# Patient Record
Sex: Female | Born: 1950 | Race: White | Hispanic: No | Marital: Married | Smoking: Former smoker
Health system: Southern US, Community
[De-identification: ages and names within clinical notes are randomized; demographics above are authoritative.]

## PROBLEM LIST (undated history)

## (undated) DIAGNOSIS — E538 Deficiency of other specified B group vitamins: Secondary | ICD-10-CM

## (undated) DIAGNOSIS — K760 Fatty (change of) liver, not elsewhere classified: Secondary | ICD-10-CM

## (undated) DIAGNOSIS — Z8619 Personal history of other infectious and parasitic diseases: Secondary | ICD-10-CM

## (undated) DIAGNOSIS — M1990 Unspecified osteoarthritis, unspecified site: Secondary | ICD-10-CM

## (undated) DIAGNOSIS — K5190 Ulcerative colitis, unspecified, without complications: Secondary | ICD-10-CM

## (undated) DIAGNOSIS — F329 Major depressive disorder, single episode, unspecified: Secondary | ICD-10-CM

## (undated) DIAGNOSIS — F32A Depression, unspecified: Secondary | ICD-10-CM

## (undated) DIAGNOSIS — J189 Pneumonia, unspecified organism: Secondary | ICD-10-CM

## (undated) DIAGNOSIS — K219 Gastro-esophageal reflux disease without esophagitis: Secondary | ICD-10-CM

## (undated) DIAGNOSIS — D649 Anemia, unspecified: Secondary | ICD-10-CM

## (undated) DIAGNOSIS — K449 Diaphragmatic hernia without obstruction or gangrene: Secondary | ICD-10-CM

## (undated) HISTORY — DX: Major depressive disorder, single episode, unspecified: F32.9

## (undated) HISTORY — DX: Ulcerative colitis, unspecified, without complications: K51.90

## (undated) HISTORY — DX: Gastro-esophageal reflux disease without esophagitis: K21.9

## (undated) HISTORY — DX: Depression, unspecified: F32.A

## (undated) HISTORY — PX: TONSILLECTOMY: SUR1361

## (undated) HISTORY — DX: Fatty (change of) liver, not elsewhere classified: K76.0

## (undated) HISTORY — PX: APPENDECTOMY: SHX54

## (undated) HISTORY — DX: Diaphragmatic hernia without obstruction or gangrene: K44.9

## (undated) HISTORY — DX: Personal history of other infectious and parasitic diseases: Z86.19

## (undated) HISTORY — DX: Deficiency of other specified B group vitamins: E53.8

---

## 1986-06-09 HISTORY — PX: TUBAL LIGATION: SHX77

## 1993-06-09 HISTORY — PX: TOTAL ABDOMINAL HYSTERECTOMY W/ BILATERAL SALPINGOOPHORECTOMY: SHX83

## 1998-04-19 ENCOUNTER — Ambulatory Visit (HOSPITAL_COMMUNITY): Admission: RE | Admit: 1998-04-19 | Discharge: 1998-04-19 | Payer: Self-pay | Admitting: Obstetrics and Gynecology

## 1999-01-09 ENCOUNTER — Ambulatory Visit (HOSPITAL_COMMUNITY): Admission: RE | Admit: 1999-01-09 | Discharge: 1999-01-09 | Payer: Self-pay | Admitting: Gastroenterology

## 1999-01-09 ENCOUNTER — Encounter: Payer: Self-pay | Admitting: Gastroenterology

## 1999-04-19 ENCOUNTER — Other Ambulatory Visit: Admission: RE | Admit: 1999-04-19 | Discharge: 1999-04-19 | Payer: Self-pay | Admitting: Obstetrics and Gynecology

## 1999-04-29 ENCOUNTER — Other Ambulatory Visit: Admission: RE | Admit: 1999-04-29 | Discharge: 1999-04-29 | Payer: Self-pay | Admitting: Obstetrics and Gynecology

## 1999-04-29 ENCOUNTER — Encounter (INDEPENDENT_AMBULATORY_CARE_PROVIDER_SITE_OTHER): Payer: Self-pay | Admitting: Specialist

## 1999-10-22 ENCOUNTER — Encounter: Payer: Self-pay | Admitting: Obstetrics and Gynecology

## 1999-10-22 ENCOUNTER — Ambulatory Visit (HOSPITAL_COMMUNITY): Admission: RE | Admit: 1999-10-22 | Discharge: 1999-10-22 | Payer: Self-pay | Admitting: Obstetrics and Gynecology

## 2001-04-01 ENCOUNTER — Encounter (INDEPENDENT_AMBULATORY_CARE_PROVIDER_SITE_OTHER): Payer: Self-pay | Admitting: Specialist

## 2001-04-01 ENCOUNTER — Other Ambulatory Visit: Admission: RE | Admit: 2001-04-01 | Discharge: 2001-04-01 | Payer: Self-pay | Admitting: Gastroenterology

## 2001-06-07 DIAGNOSIS — K5150 Left sided colitis without complications: Secondary | ICD-10-CM

## 2003-10-30 ENCOUNTER — Encounter: Admission: RE | Admit: 2003-10-30 | Discharge: 2004-01-28 | Payer: Self-pay | Admitting: Neurology

## 2005-06-26 ENCOUNTER — Ambulatory Visit: Payer: Self-pay | Admitting: Gastroenterology

## 2005-07-21 ENCOUNTER — Encounter (INDEPENDENT_AMBULATORY_CARE_PROVIDER_SITE_OTHER): Payer: Self-pay | Admitting: *Deleted

## 2005-07-21 ENCOUNTER — Ambulatory Visit: Payer: Self-pay | Admitting: Gastroenterology

## 2005-12-09 ENCOUNTER — Ambulatory Visit (HOSPITAL_COMMUNITY): Admission: RE | Admit: 2005-12-09 | Discharge: 2005-12-09 | Payer: Self-pay | Admitting: Obstetrics and Gynecology

## 2007-01-20 ENCOUNTER — Encounter: Admission: RE | Admit: 2007-01-20 | Discharge: 2007-01-20 | Payer: Self-pay | Admitting: Family Medicine

## 2007-01-26 ENCOUNTER — Encounter: Admission: RE | Admit: 2007-01-26 | Discharge: 2007-01-26 | Payer: Self-pay | Admitting: Family Medicine

## 2007-04-22 ENCOUNTER — Ambulatory Visit: Payer: Self-pay | Admitting: Gastroenterology

## 2007-04-22 LAB — CONVERTED CEMR LAB
AST: 17 units/L (ref 0–37)
Albumin: 3.3 g/dL — ABNORMAL LOW (ref 3.5–5.2)
Bilirubin, Direct: 0.1 mg/dL (ref 0.0–0.3)
CO2: 29 meq/L (ref 19–32)
Creatinine, Ser: 0.7 mg/dL (ref 0.4–1.2)
Eosinophils Absolute: 0.5 10*3/uL (ref 0.0–0.6)
Eosinophils Relative: 6.4 % — ABNORMAL HIGH (ref 0.0–5.0)
Folate: 5.9 ng/mL
Glucose, Bld: 93 mg/dL (ref 70–99)
Hemoglobin: 12.4 g/dL (ref 12.0–15.0)
Iron: 44 ug/dL (ref 42–145)
Lymphocytes Relative: 28.9 % (ref 12.0–46.0)
MCV: 88.9 fL (ref 78.0–100.0)
Monocytes Absolute: 0.6 10*3/uL (ref 0.2–0.7)
Neutro Abs: 4.2 10*3/uL (ref 1.4–7.7)
Platelets: 465 10*3/uL — ABNORMAL HIGH (ref 150–400)
Potassium: 4.1 meq/L (ref 3.5–5.1)
Sodium: 138 meq/L (ref 135–145)
Total Bilirubin: 0.5 mg/dL (ref 0.3–1.2)
Total Protein: 6.4 g/dL (ref 6.0–8.3)
Transferrin: 255.1 mg/dL (ref >=212.0)
WBC: 7.4 10*3/uL (ref 4.5–10.5)

## 2007-04-27 ENCOUNTER — Ambulatory Visit: Payer: Self-pay | Admitting: Gastroenterology

## 2007-05-04 ENCOUNTER — Ambulatory Visit: Payer: Self-pay | Admitting: Gastroenterology

## 2007-05-11 ENCOUNTER — Ambulatory Visit: Payer: Self-pay | Admitting: Gastroenterology

## 2007-06-28 DIAGNOSIS — J309 Allergic rhinitis, unspecified: Secondary | ICD-10-CM | POA: Insufficient documentation

## 2007-06-28 DIAGNOSIS — K219 Gastro-esophageal reflux disease without esophagitis: Secondary | ICD-10-CM | POA: Insufficient documentation

## 2007-06-28 DIAGNOSIS — E538 Deficiency of other specified B group vitamins: Secondary | ICD-10-CM

## 2007-06-28 DIAGNOSIS — J45909 Unspecified asthma, uncomplicated: Secondary | ICD-10-CM | POA: Insufficient documentation

## 2007-06-28 DIAGNOSIS — K449 Diaphragmatic hernia without obstruction or gangrene: Secondary | ICD-10-CM | POA: Insufficient documentation

## 2007-08-06 ENCOUNTER — Ambulatory Visit: Payer: Self-pay | Admitting: Gastroenterology

## 2007-08-20 ENCOUNTER — Ambulatory Visit: Payer: Self-pay | Admitting: Gastroenterology

## 2007-08-20 LAB — CONVERTED CEMR LAB
Basophils Absolute: 0 10*3/uL (ref 0.0–0.1)
HCT: 37.7 % (ref 36.0–46.0)
Hemoglobin: 12.3 g/dL (ref 12.0–15.0)
Lymphocytes Relative: 29.4 % (ref 12.0–46.0)
MCHC: 32.6 g/dL (ref 30.0–36.0)
MCV: 91.2 fL (ref 78.0–100.0)
Monocytes Absolute: 0.7 10*3/uL (ref 0.2–0.7)
Monocytes Relative: 6.1 % (ref 3.0–11.0)
Neutro Abs: 6.6 10*3/uL (ref 1.4–7.7)
Neutrophils Relative %: 61.1 % (ref 43.0–77.0)
Sed Rate: 23 mm/hr (ref 0–25)

## 2007-09-07 ENCOUNTER — Ambulatory Visit: Payer: Self-pay | Admitting: Gastroenterology

## 2007-09-07 LAB — CONVERTED CEMR LAB
ALT: 25 units/L (ref 0–35)
AST: 17 units/L (ref 0–37)
Basophils Absolute: 0.2 10*3/uL — ABNORMAL HIGH (ref 0.0–0.1)
Bilirubin, Direct: 0.1 mg/dL (ref 0.0–0.3)
Hemoglobin: 12.4 g/dL (ref 12.0–15.0)
Lymphocytes Relative: 29.9 % (ref 12.0–46.0)
MCHC: 32.5 g/dL (ref 30.0–36.0)
Monocytes Absolute: 0.4 10*3/uL (ref 0.1–1.0)
Monocytes Relative: 5.2 % (ref 3.0–12.0)
Neutro Abs: 4.5 10*3/uL (ref 1.4–7.7)
Platelets: 577 10*3/uL — ABNORMAL HIGH (ref 150–400)
RDW: 13.5 % (ref 11.5–14.6)
Total Bilirubin: 0.6 mg/dL (ref 0.3–1.2)

## 2007-10-21 ENCOUNTER — Ambulatory Visit: Payer: Self-pay | Admitting: Gastroenterology

## 2007-10-21 LAB — CONVERTED CEMR LAB
Albumin: 3.6 g/dL (ref 3.5–5.2)
Basophils Absolute: 0 10*3/uL (ref 0.0–0.1)
Basophils Relative: 0.4 % (ref 0.0–1.0)
Eosinophils Absolute: 0.2 10*3/uL (ref 0.0–0.7)
Eosinophils Relative: 3.5 % (ref 0.0–5.0)
HCT: 39.1 % (ref 36.0–46.0)
MCHC: 32.7 g/dL (ref 30.0–36.0)
MCV: 93.6 fL (ref 78.0–100.0)
Monocytes Absolute: 0.4 10*3/uL (ref 0.1–1.0)
Neutro Abs: 4.1 10*3/uL (ref 1.4–7.7)
Neutrophils Relative %: 60.7 % (ref 43.0–77.0)
RBC: 4.17 M/uL (ref 3.87–5.11)
Total Protein: 6.8 g/dL (ref 6.0–8.3)
WBC: 6.6 10*3/uL (ref 4.5–10.5)

## 2007-11-12 ENCOUNTER — Encounter: Payer: Self-pay | Admitting: Gastroenterology

## 2007-12-07 ENCOUNTER — Ambulatory Visit: Payer: Self-pay | Admitting: Gastroenterology

## 2007-12-07 LAB — CONVERTED CEMR LAB
ALT: 27 units/L (ref 0–35)
AST: 21 units/L (ref 0–37)
Basophils Relative: 0.9 % (ref 0.0–1.0)
Eosinophils Relative: 5.1 % — ABNORMAL HIGH (ref 0.0–5.0)
HCT: 37.4 % (ref 36.0–46.0)
Hemoglobin: 12.6 g/dL (ref 12.0–15.0)
Lymphocytes Relative: 27.9 % (ref 12.0–46.0)
Monocytes Absolute: 0.3 10*3/uL (ref 0.1–1.0)
Monocytes Relative: 5.5 % (ref 3.0–12.0)
Neutro Abs: 3.8 10*3/uL (ref 1.4–7.7)
RBC: 3.97 M/uL (ref 3.87–5.11)
RDW: 15.2 % — ABNORMAL HIGH (ref 11.5–14.6)
Total Bilirubin: 0.7 mg/dL (ref 0.3–1.2)
Total Protein: 6.8 g/dL (ref 6.0–8.3)
WBC: 6.3 10*3/uL (ref 4.5–10.5)

## 2008-02-11 ENCOUNTER — Ambulatory Visit: Payer: Self-pay | Admitting: Gastroenterology

## 2008-02-11 LAB — CONVERTED CEMR LAB
AST: 22 units/L (ref 0–37)
Alkaline Phosphatase: 102 units/L (ref 39–117)
Basophils Absolute: 0.1 10*3/uL (ref 0.0–0.1)
Bilirubin, Direct: 0.1 mg/dL (ref 0.0–0.3)
Eosinophils Absolute: 0.4 10*3/uL (ref 0.0–0.7)
Eosinophils Relative: 7.6 % — ABNORMAL HIGH (ref 0.0–5.0)
Folate: 8.7 ng/mL
Lymphocytes Relative: 27 % (ref 12.0–46.0)
MCV: 94.4 fL (ref 78.0–100.0)
Neutrophils Relative %: 56.1 % (ref 43.0–77.0)
Platelets: 550 10*3/uL — ABNORMAL HIGH (ref 150–400)
Saturation Ratios: 19.7 % — ABNORMAL LOW (ref 20.0–50.0)
Sed Rate: 32 mm/hr — ABNORMAL HIGH (ref 0–22)
Total Protein: 6.6 g/dL (ref 6.0–8.3)
Vitamin B-12: 427 pg/mL (ref 211–911)
WBC: 5.1 10*3/uL (ref 4.5–10.5)

## 2008-02-16 ENCOUNTER — Ambulatory Visit: Payer: Self-pay | Admitting: Gastroenterology

## 2008-02-16 ENCOUNTER — Encounter: Payer: Self-pay | Admitting: Gastroenterology

## 2008-02-21 ENCOUNTER — Telehealth: Payer: Self-pay | Admitting: Gastroenterology

## 2008-02-22 ENCOUNTER — Ambulatory Visit: Payer: Self-pay | Admitting: Gastroenterology

## 2008-02-24 ENCOUNTER — Telehealth: Payer: Self-pay | Admitting: Gastroenterology

## 2008-02-25 ENCOUNTER — Encounter: Payer: Self-pay | Admitting: Gastroenterology

## 2008-02-29 ENCOUNTER — Ambulatory Visit: Payer: Self-pay | Admitting: Gastroenterology

## 2008-02-29 DIAGNOSIS — T50905S Adverse effect of unspecified drugs, medicaments and biological substances, sequela: Secondary | ICD-10-CM | POA: Insufficient documentation

## 2008-03-01 ENCOUNTER — Ambulatory Visit: Payer: Self-pay | Admitting: Gastroenterology

## 2008-03-14 ENCOUNTER — Ambulatory Visit: Payer: Self-pay | Admitting: Gastroenterology

## 2008-04-07 ENCOUNTER — Telehealth: Payer: Self-pay | Admitting: Gastroenterology

## 2008-04-18 ENCOUNTER — Ambulatory Visit: Payer: Self-pay | Admitting: Gastroenterology

## 2008-06-05 ENCOUNTER — Ambulatory Visit: Payer: Self-pay | Admitting: Gastroenterology

## 2009-06-14 ENCOUNTER — Ambulatory Visit (HOSPITAL_COMMUNITY): Admission: RE | Admit: 2009-06-14 | Discharge: 2009-06-14 | Payer: Self-pay | Admitting: Obstetrics and Gynecology

## 2010-10-22 NOTE — Assessment & Plan Note (Signed)
[facility]                         GASTROENTEROLOGY OFFICE NOTE   Whitney Vance, Whitney Vance                     MRN:          [id number]  DATE:04/22/2007                            DOB:          08/31/1950    Ellina was in remission and had a negative colonoscopy with dysplasia on  screening biopsies in February of 2007. After that, she self  discontinued her and has done well until the last several months  when she initially presented to Dr. Marinda Elk with upper abdominal  pain and underwent ultrasound and CT scan of the abdomen, both of which  were apparently normal. She now has had 6-8 weeks of crampy lower  abdominal pain with bloody diarrhea and mild general malaise. She denies  NSAID use or recent antibiotic use. She is on a low fiber diet. Is not  having much abdominal gas or pain. She has had no foreign travel or  known infectious disease exposure. Review of her chart does show that  she has had previous C-difficile colitis.   In talking closely with Kursten, it is rather obvious that she took some  NSAIDs for plantar fasciitis in early August when her colitis flared.  She denies NSAID use at this time and is taking Paxil 20 mg a day,  Prozac 20 mg a day, over-the-counter Prilosec, sinus decongestant  medication and estradiol 0.625 mg  a day. She denies drug allergies.   She is a healthy-appearing, non-toxic white female appearing her stated  age in no distress. She weighs 201 pounds, which is up some 40 pounds  from October of 2002. Blood pressure 120/78, pulse 70 and regular. I  could not appreciate stigmata of chronic liver disease, skin rashes or  edema. Her mental status was clear.  ABDOMEN: Showed no organomegaly, masses, or localized tenderness. Bowel  sounds were normal.  Inspection of the rectum was unremarkable as was rectal examination.  There was soft loose mucusy stool in the rectal vault that was +1 guaiac  positive.   ASSESSMENT:  Blaklee has had a flare up of ulcerative colitis probably  from NSAID use. We will check stool examinations to exclude C-difficile  infection. On reviewing her chart, all of her colitis has been in the  distal rectosigmoid area. She also has a history of sacroiliitis when  her colitis flares. Her previous upper abdominal pain etiology remains  unclear and she had apparently rather a thorough workup to exclude  cholelithiasis.   RECOMMENDATIONS:  1. Check screening laboratory parameters including sed-rate.  2. Low fiber diet as tolerated.  3. Start oral Lialda 2.4 grams a day.  4. Rowasa 4 gram enemas at bedtime with Canasa 1 gram suppository in      the morning.  5. P.r.n. Imodium use.  6. Stool for C-difficile toxin.  7. Avoid NSAIDs and other salicylates.  8. Office followup in two weeks time.     Vania Rea. Jarold Motto, MD, Caleen [place], FAGA  Electronically Signed    DRP/MedQ  DD: 04/22/2007  DT: 04/22/2007  Job #: [id number]

## 2010-10-22 NOTE — Assessment & Plan Note (Signed)
[facility]                         GASTROENTEROLOGY OFFICE NOTE   Whitney Vance, Whitney Vance                       MRN:          [id number]  DATE:08/06/2007                            DOB:          1951-05-31    Taquilla was seen in mid-November with a flare of her colitis, was placed  on Lialda 2.4 g a day, along with Rowasa 4 g enemas at bedtime and 1 g  Canasa suppositories in the morning.  She has had worsening of her  complaints and is now complaining of four to five stools a day with  occasional heme and some crampy lower abdominal pain, but no real  systemic complaints.  As per my previous notes, she has discontinued her  therapy approximately a year and a half ago.  I actually did see the  patient on May 04, 2007, at which time she was remarkably better  and I am surprised she is having such a flare at this time.  She  personally feels that the topical therapy has made her complaints worse.  She denies antibiotics use, but on scanning her chart, I see where she  has had C. difficile infection in the past.  She has certainly had no  fever, chills, skin rashes, joint pains, oral stomatitis or any upper GI  or hepatobiliary complaints.  She also denies any systemic complaints,  anorexia, weight-loss or general malaise, etc.   She is on a variety of medications, including over-the-counter Prilosec,  calcium, aminosalicylates and Paxil 20 mg a day.  She was borderline B12  deficient.  We have begun parenteral B12 replacement and she is on nasal  spray weekly.   EXAM:  Shows her to be awake and alert, in no acute distress.  She  appears her stated age.  She weighs 196 pounds and blood pressure 112/70 and pulse was 70 and  regular.  Her abdominal exam is entirely benign without organomegaly, masses or  tenderness.  Bowel sounds were normal.  Inspection of rectum was unremarkable, as was rectal exam, with soft,  liquidy stool that was not  guaiac-positive at this time.   ASSESSMENT:  Mrs. Monaco has had a flare of her colitis, which has not  been controlled with oral and topical aminosalicylate therapy.  In fact,  she may have had worsening with Rowasa enemas.  She was under good  control for many years on therapy and we need to get her back on  chronic immunosuppressive therapy.   RECOMMENDATIONS:  1. Brief course of steroid therapy in the form of prednisone 30 mg a      day for two weeks, then 20 mg a day for two weeks.  Office followup      in one month.  2. Discontinue Rowasa and Canasa.  3. Continue Lialda 2.4 g a day.  4. Restart 100 mg a day.  5. Check CBC and sed rate in two weeks' time and when she returns in      one month.  6. Continue other medications, listed above, per Dr. Foy Guadalajara.     Onalee Hua  Hale Bogus, MD, Caleen [place], [place]  Electronically Signed    DRP/MedQ  DD: 08/06/2007  DT: 08/06/2007  Job #: [id number]   cc:   Molly Maduro L. Foy Guadalajara, M.D.

## 2010-10-22 NOTE — Assessment & Plan Note (Signed)
[facility]                         GASTROENTEROLOGY OFFICE NOTE   Whitney Vance, Whitney Vance                       MRN:          [id number]  DATE:09/07/2007                            DOB:          [birth year]    HISTORY OF PRESENT ILLNESS:  Whitney Vance has had a rather remarkable response  to corticosteroid therapy, is now in remission without diarrhea,  abdominal pain, rectal bleeding.  She was on prednisone 30 mg a day for  two weeks and has completed a two week course of 20 mg daily while  continuing her Lialda 2.4 g a day, and we have also reinstituted 100  mg daily.  She was on that beginning in the year 2000 and discontinued a  year and a half ago.  She had mostly left sided ulcerative colitis that  has been well maintained in the past on immunosuppressive therapy.   PHYSICAL EXAMINATION:  VITAL SIGNS:  All normal.  ABDOMEN:  Unremarkable.   RECOMMENDATIONS:  1. Continue prednisone taper at a level of 5 mg per day every two      weeks.  2. Continue Lialda and at current doses.  3. Check CBC and liver profile today, again in a month with office      follow up in six weeks time.  4. Patient to call, will adjust her medications according to her      symptoms should she have a relapse with this rather rapid steroid      taper.     Vania Rea. Jarold Motto, MD, Caleen [place], FAGA  Electronically Signed    DRP/MedQ  DD: 09/07/2007  DT: 09/07/2007  Job #: [id number]   cc:   Molly Maduro L. Foy Guadalajara, M.D.

## 2012-08-28 ENCOUNTER — Other Ambulatory Visit: Payer: Self-pay | Admitting: Nurse Practitioner

## 2012-08-31 NOTE — Telephone Encounter (Signed)
08/31/12 LMTCB needs VIt D Level checked on AEXAM--Needs to schedule Aex.cm

## 2013-09-28 HISTORY — PX: TARSAL TUNNEL RELEASE: SUR1099

## 2013-09-28 HISTORY — PX: FOOT SURGERY: SHX648

## 2013-11-15 ENCOUNTER — Encounter: Payer: Self-pay | Admitting: Internal Medicine

## 2013-11-16 ENCOUNTER — Ambulatory Visit (INDEPENDENT_AMBULATORY_CARE_PROVIDER_SITE_OTHER): Payer: 59 | Admitting: Internal Medicine

## 2013-11-16 ENCOUNTER — Encounter: Payer: Self-pay | Admitting: Internal Medicine

## 2013-11-16 VITALS — BP 110/78 | HR 70 | Ht 63.0 in | Wt 207.0 lb

## 2013-11-16 DIAGNOSIS — K5130 Ulcerative (chronic) rectosigmoiditis without complications: Secondary | ICD-10-CM

## 2013-11-16 DIAGNOSIS — A0472 Enterocolitis due to Clostridium difficile, not specified as recurrent: Secondary | ICD-10-CM

## 2013-11-16 MED ORDER — SACCHAROMYCES BOULARDII 250 MG PO CAPS: 250.0000 mg | ORAL_CAPSULE | Freq: Two times a day (BID) | ORAL | Status: DC

## 2013-11-16 NOTE — Patient Instructions (Signed)
We have sent the following medications to your pharmacy for you to pick up at your convenience:  Florastor 250 mg twice a day  Take flagyl for 10 days, if your PCP did not prescribe it for 10 days please call us and we will send in a prescription.  Follow up in 2-3  Weeks appointment can be with a P.A.

## 2013-11-16 NOTE — Progress Notes (Signed)
Patient ID: Whitney Vance, female   DOB: 06/15/1950, 63 y.o.   MRN: [id number] HPI: Whitney Vance is a 63 yo female with past medical history of ulcerative proctosigmoiditis, GERD, B12 deficiency who was previously followed by Whitney Vance but has been lost to followup returning today to reestablish care with colitis complaints. She was referred by her primary care provider, Whitney Vance.  She reports she had many years of proctosigmoiditis and required oral and per rectum 5-ASA as well as cort enemas.  She reports she has been in clinical remission for many years. She reports occasional flares if she ate certain foods such as red meat, but symptoms would resolve when she eliminated the inciting food. She reports she was treated several months ago for URI and tonsillitis with antibiotics, and then she had right foot surgery on 09/28/2013 also requiring antibiotics. 3 weeks ago she developed lower abdominal cramping pain, rectal pressure which seemed to radiate down her legs, increased gas and tenesmus. She developed mucus and blood in her stools. Now over the past 5-6 days she's had pure diarrhea occurring 5-6 times per day with fecal urgency. Stool studies were performed recently by primary care and she was told by phone while she was driving here today that her stool was positive for C. difficile. She said Flagyl was called and she has not picked this up.  She denies fevers or chills. Reports good appetite. No heartburn, dysphagia or odynophagia. No nausea or vomiting. She has never had C. difficile before. Her mother had ulcerative colitis  Past Medical History  Diagnosis Date  . Vitamin B 12 deficiency   . Ulcerative colitis   . Asthma   . Hiatal hernia   . GERD (gastroesophageal reflux disease)   . H/O Clostridium difficile infection     has it now 11/16/2013  . Depression     Past Surgical History  Procedure Laterality Date  . Foot surgery      09/2013  . Vaginal hysterectomy     . Appendectomy      Current Outpatient Prescriptions  Medication Sig Dispense Refill  . Glucosamine-Chondroit-Vit C-Mn (GLUCOSAMINE 1500 COMPLEX) CAPS Take by mouth 2 (two) times daily.      . Lactobacillus Rhamnosus, GG, (CULTURELLE) CAPS Take by mouth.      Marland Kitchen omeprazole (PRILOSEC) 20 MG capsule Take 20 mg by mouth daily.      Marland Kitchen PARoxetine (PAXIL) 20 MG tablet Take 20 mg by mouth daily.      Marland Kitchen saccharomyces boulardii (FLORASTOR) 250 MG capsule Take 1 capsule (250 mg total) by mouth 2 (two) times daily.  60 capsule  3   No current facility-administered medications for this visit.    Allergies  Allergen Reactions  . Demerol [Meperidine]     Family History  Problem Relation Age of Onset  . Ulcerative colitis Mother     History  Substance Use Topics  . Smoking status: Former Games developer  . Smokeless tobacco: Never Used  . Alcohol Use: No    ROS: As per history of present illness, otherwise negative  BP 110/78  Pulse 70  Ht 5\' 3"  (1.6 m)  Wt 207 lb (93.895 kg)  BMI 36.68 kg/m2 Constitutional: Well-developed and well-nourished. No distress. HEENT: Normocephalic and atraumatic. Oropharynx is clear and moist. No oropharyngeal exudate. Conjunctivae are normal.  No scleral icterus. Neck: Neck supple. Trachea midline. Cardiovascular: Normal rate, regular rhythm and intact distal pulses. No M/R/G Pulmonary/chest: Effort normal and breath sounds normal.  No wheezing, rales or rhonchi. Abdominal: Soft, mild lower abdominal tenderness without rebound or guard, nondistended. Bowel sounds active throughout.  Extremities: no clubbing, cyanosis, or edema, orthotic shoe on right foot Lymphadenopathy: No cervical adenopathy noted. Neurological: Alert and oriented to person place and time. Skin: Skin is warm and dry. No rashes noted. Psychiatric: Normal mood and affect. Behavior is normal.  RELEVANT LABS --Labs done recently by [place]. Records have  been requested  Flexible sigmoidoscopy September 09 -- Dr. Jarold MottoPatterson, proctosigmoiditis to 18 cm, otherwise normal Colonoscopy February 07 -- normal, colitis in remission  ASSESSMENT/PLAN: 63 yo female with past medical history of ulcerative proctosigmoiditis, GERD, B12 deficiency who was previously followed by Dr. Sheryn Bisonavid Vance but has been lost to followup returning today to reestablish care with colitis complaints.  1.  C. difficile colitis/history of proctosigmoiditis -- difficult to know if all of her symptoms are secondary to C. difficile colitis or if her proctosigmoiditis has recurred. Certainly C. difficile infection can worsen proctosigmoiditis. I recommend that she proceed with Flagyl 500 mg 3 times a day x10 days and also begin Florastor 250 mg twice daily for one month. She may require treatment of proctosigmoiditis with 5-ASA, or even steroid, but I would like her to complete Flagyl therapy first to determine if further treatment is necessary. I do think she needs repeat colonoscopy for reassessment but after adequate treatment for and resolution of C. Difficile. -- I have asked that she return in 2 weeks to see me for an advanced practitioner for reassessment. If symptoms persist, would ensure C. difficile has been adequately treated and resolved, and then begin therapy for proctosigmoiditis and arrange procedure. --Labs requested from Dr. [place]

## 2013-11-23 ENCOUNTER — Other Ambulatory Visit (HOSPITAL_COMMUNITY): Payer: Self-pay | Admitting: Family

## 2013-11-23 ENCOUNTER — Telehealth: Payer: Self-pay | Admitting: Internal Medicine

## 2013-11-23 DIAGNOSIS — Z1231 Encounter for screening mammogram for malignant neoplasm of breast: Secondary | ICD-10-CM

## 2013-11-23 MED ORDER — MESALAMINE 1000 MG RE SUPP: 1000.0000 mg | Freq: Every day | RECTAL | Status: DC

## 2013-11-23 MED ORDER — VANCOMYCIN 50 MG/ML ORAL SOLUTION: 125.0000 mg | Freq: Four times a day (QID) | ORAL | Status: DC

## 2013-11-23 NOTE — Telephone Encounter (Signed)
Patient reports very minimal improvement in her symptoms on cipro and flagyl.  She will complete the antibiotics on Saturday.  She is still having cramping, bloody diarrhea 6-7 times a day.  Please advise the next step

## 2013-11-23 NOTE — Telephone Encounter (Signed)
Patient notified rx sent to the requested pharmacies

## 2013-11-23 NOTE — Telephone Encounter (Signed)
This patient was diagnosed with C. difficile by PCR performed by primary care She is being treated with metronidazole only, not Cipro If she has failed to respond, I would change to oral vancomycin 250 mg 4 times a day x10 days She may require 5-ASA therapy given her history of proctosigmoiditis, but C. difficile colitis needs to be adequately treated first. She can start Canasa suppository 1000 mg each bedtime Have her call prior to stopping vancomycin to update us on her symptoms

## 2013-12-02 ENCOUNTER — Other Ambulatory Visit (INDEPENDENT_AMBULATORY_CARE_PROVIDER_SITE_OTHER): Payer: 59

## 2013-12-02 ENCOUNTER — Telehealth: Payer: Self-pay | Admitting: Internal Medicine

## 2013-12-02 ENCOUNTER — Other Ambulatory Visit: Payer: Self-pay

## 2013-12-02 DIAGNOSIS — R197 Diarrhea, unspecified: Secondary | ICD-10-CM

## 2013-12-02 DIAGNOSIS — R109 Unspecified abdominal pain: Secondary | ICD-10-CM

## 2013-12-02 LAB — COMPREHENSIVE METABOLIC PANEL
ALT: 17 U/L (ref 0–35)
AST: 19 U/L (ref 0–37)
Albumin: 3.9 g/dL (ref 3.5–5.2)
Alkaline Phosphatase: 111 U/L (ref 39–117)
BUN: 11 mg/dL (ref 6–23)
CO2: 29 mEq/L (ref 19–32)
Calcium: 9.2 mg/dL (ref 8.4–10.5)
Chloride: 102 mEq/L (ref 96–112)
Creatinine, Ser: 0.7 mg/dL (ref 0.4–1.2)
GFR: 92.72 mL/min (ref >60.00)
Glucose, Bld: 95 mg/dL (ref 70–99)
Potassium: 4 mEq/L (ref 3.5–5.1)
Sodium: 138 mEq/L (ref 135–145)
Total Bilirubin: 0.3 mg/dL (ref 0.2–1.2)
Total Protein: 7.9 g/dL (ref 6.0–8.3)

## 2013-12-02 LAB — CBC WITH DIFFERENTIAL/PLATELET
BASOS PCT: 0.6 % (ref 0.0–3.0)
Basophils Absolute: 0.1 10*3/uL (ref 0.0–0.1)
EOS ABS: 0.4 10*3/uL (ref 0.0–0.7)
EOS PCT: 3.7 % (ref 0.0–5.0)
HCT: 39.1 % (ref 36.0–46.0)
Hemoglobin: 13 g/dL (ref 12.0–15.0)
LYMPHS PCT: 27.1 % (ref 12.0–46.0)
Lymphs Abs: 2.9 10*3/uL (ref 0.7–4.0)
MCHC: 33.3 g/dL (ref 30.0–36.0)
MCV: 88.4 fl (ref 78.0–100.0)
Monocytes Absolute: 0.7 10*3/uL (ref 0.1–1.0)
Monocytes Relative: 6.9 % (ref 3.0–12.0)
Neutro Abs: 6.5 10*3/uL (ref 1.4–7.7)
Neutrophils Relative %: 61.7 % (ref 43.0–77.0)
Platelets: 516 10*3/uL — ABNORMAL HIGH (ref 150.0–400.0)
RBC: 4.43 Mil/uL (ref 3.87–5.11)
RDW: 15.3 % (ref 11.5–15.5)
WBC: 10.6 10*3/uL — AB (ref 4.0–10.5)

## 2013-12-02 LAB — C-REACTIVE PROTEIN: CRP: 3.6 mg/dL (ref 0.5–20.0)

## 2013-12-02 NOTE — Telephone Encounter (Signed)
Patient agrees to come today for the lab work-she has an appt on 12/05/13 with Amy Esterwood-an appt is scheduled for CT abd.with contrast for the afternoon of 12/05/13-left her a message to call back about the CT

## 2013-12-02 NOTE — Telephone Encounter (Signed)
Patient calls reporting that she is being awakened by the urgent need to go to the bathroom. She is passing gas/blood and sees the most blood first thing in the morning. Has 7 to 8 stools a day. Left sided abdominal tenderness with new onset of low back discomfort. Afebrile, actually c/o feeling cold at times.She is nauseated. Please advise.

## 2013-12-02 NOTE — Telephone Encounter (Signed)
Have her come for CBC, CMP, CRP Also the CT scan of the abdomen and pelvis with contrast  Repeat C diff PCR (run STAT) She may require steroids for acute colitis flare but would like CT imaging 1st

## 2013-12-05 ENCOUNTER — Encounter: Payer: Self-pay | Admitting: Physician Assistant

## 2013-12-05 ENCOUNTER — Ambulatory Visit: Payer: 59

## 2013-12-05 ENCOUNTER — Ambulatory Visit (INDEPENDENT_AMBULATORY_CARE_PROVIDER_SITE_OTHER): Payer: 59 | Admitting: Physician Assistant

## 2013-12-05 ENCOUNTER — Other Ambulatory Visit: Payer: 59

## 2013-12-05 VITALS — BP 134/72 | HR 96 | Ht 63.0 in | Wt 207.0 lb

## 2013-12-05 DIAGNOSIS — R197 Diarrhea, unspecified: Secondary | ICD-10-CM

## 2013-12-05 DIAGNOSIS — A0472 Enterocolitis due to Clostridium difficile, not specified as recurrent: Secondary | ICD-10-CM

## 2013-12-05 DIAGNOSIS — K51919 Ulcerative colitis, unspecified with unspecified complications: Secondary | ICD-10-CM

## 2013-12-05 DIAGNOSIS — K5190 Ulcerative colitis, unspecified, without complications: Secondary | ICD-10-CM

## 2013-12-05 MED ORDER — MOVIPREP 100 G PO SOLR: 1.0000 | Freq: Once | ORAL | Status: DC

## 2013-12-05 MED ORDER — ONDANSETRON HCL 4 MG PO TABS: 4.0000 mg | ORAL_TABLET | Freq: Three times a day (TID) | ORAL | Status: DC | PRN

## 2013-12-05 MED ORDER — DICYCLOMINE HCL 10 MG PO CAPS: 10.0000 mg | ORAL_CAPSULE | Freq: Three times a day (TID) | ORAL | Status: DC

## 2013-12-05 MED ORDER — MOVIPREP 100 G PO SOLR: 1.0000 | ORAL | Status: DC

## 2013-12-05 MED ORDER — VANCOMYCIN 50 MG/ML ORAL SOLUTION: 125.0000 mg | Freq: Four times a day (QID) | ORAL | Status: DC

## 2013-12-05 NOTE — Patient Instructions (Addendum)
Please go to the basement level lab for a stool study. We sent refills to [facility] for: 1. Vancomycin liquid 2. zofran 3. Bentyl 10 mg  You have been scheduled for a colonoscopy. Please follow written instructions given to you at your visit today.  Please pick up your prep kit at the pharmacy within the next 1-3 days. If you use inhalers (even only as needed), please bring them with you on the day of your procedure. Your physician has requested that you go to www.startemmi.com and enter the access code given to you at your visit today. This web site gives a general overview about your procedure. However, you should still follow specific instructions given to you by our office regarding your preparation for the procedure.

## 2013-12-05 NOTE — Progress Notes (Addendum)
Subjective:    Patient ID: Whitney Vance, female    DOB: [birth year], 63 y.o.   MRN: [id number]  HPI  Whitney Vance is a pleasant 63 year old white female known recently to Whitney Vance ,former patient of Whitney Vance's with history of ulcerative proctosigmoiditis, GERD and B12 deficiency. She was seen on 11/16/2013 and stated that she had been in remission for many years until the past couple of months. She says she had a course of antibiotics a few months ago for an upper respiratory infection and also had antibiotics after foot surgery. In in early June she developed lower abdominal cramping rectal pressure gas and tenesmus type symptoms. She started noticing mucus and blood in her stools and then began with diarrhea. She had a stool for C. difficile done by her PCP and this was positive. She was started on a course of Flagyl. When seen by Whitney Vance on 610 was felt that she should have followup colonoscopy after C. difficile infection had cleared. She was asked to complete a course of Flagyl and also take floor store over the next one month. She comes in today for followup. In the interim she had called and stated she was not feeling any better and has been started on vancomycin 254 times a day. She has been on this over the past 10-11 days and says that she may be a little bit better but is still having significant symptoms. She has had 6 bowel movements today already. She says on a bad day she'll have 10 or 11 bowel movements per day some of these are very small bowel volume an urgent. She is still seeing blood with each bowel movement. No documented fever or chills. She has had some queasiness and had an episode of nausea and vomiting earlier today. She continues to complain of lower abdominal cramping . She has also been started on Canasa which she is using at bedtime. She is scheduled for CT of the abdomen and pelvis later   today    Review of Systems  Constitutional: Positive for appetite change and  fatigue.  HENT: Negative.   Eyes: Negative.   Respiratory: Negative.   Cardiovascular: Negative.   Gastrointestinal: Positive for nausea, abdominal pain, diarrhea, blood in stool and anal bleeding.  Endocrine: Negative.   Genitourinary: Negative.   Musculoskeletal: Negative.   Allergic/Immunologic: Negative.   Neurological: Negative.   Hematological: Negative.   Psychiatric/Behavioral: Negative.    Outpatient Prescriptions Prior to Visit  Medication Sig Dispense Refill  . Glucosamine-Chondroit-Vit C-Mn (GLUCOSAMINE 1500 COMPLEX) CAPS Take by mouth 2 (two) times daily.      . mesalamine (CANASA) 1000 MG suppository Place 1 suppository (1,000 mg total) rectally at bedtime.  30 suppository  12  . omeprazole (PRILOSEC) 20 MG capsule Take 20 mg by mouth daily.      Marland Kitchen. PARoxetine (PAXIL) 20 MG tablet Take 20 mg by mouth daily.      Marland Kitchen. saccharomyces boulardii (FLORASTOR) 250 MG capsule Take 1 capsule (250 mg total) by mouth 2 (two) times daily.  60 capsule  3  . vancomycin (VANCOCIN) 50 mg/mL oral solution Take 2.5 mLs (125 mg total) by mouth 4 (four) times daily.  140 mL  0  . Lactobacillus Rhamnosus, GG, (CULTURELLE) CAPS Take by mouth.       No facility-administered medications prior to visit.   Allergies  Allergen Reactions  . Demerol [Meperidine]        Patient Active Problem List   Diagnosis Date  Noted  . ADVERSE DRUG REACTION, LATE EFFECT 02/29/2008  . B12 DEFICIENCY 06/28/2007  . ALLERGIC RHINITIS 06/28/2007  . ASTHMA 06/28/2007  . GERD 06/28/2007  . HIATAL HERNIA 06/28/2007  . ULCERATIVE COLITIS, LEFT SIDED 06/07/2001   family history includes Ulcerative colitis in her mother. History   Social History  . Marital Status: Married    Spouse Name: N/A    Number of Children: N/A  . Years of Education: N/A   Occupational History  . 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 Smoking status: Former Games developermoker  . Smokeless tobacco: Never Used  . Alcohol Use: No    . Drug Use: No  . Sexual Activity: Not on file   Other Topics Concern  . Not on file   Social History Narrative  . No narrative on file     History  Substance Use Topics  . Smoking status: Former Games developermoker  . Smokeless tobacco: Never Used  . Alcohol Use: No      Physical Exam       well-developed older white female in no acute distress blood pressure 134/72 pulse 96 height 5 foot 3 weight 207. HEENT nontraumatic normocephalic EOMI PERRLA sclera anicteric, Supple no JVD, Cardiovascular regular rate and rhythm with S1-S2 no murmur or gallop, Ulnar clear bilaterally, Abdomen soft she is tender bilateral lower quadrants left greater than right there is no guarding or rebound no palpable mass or hepatosplenomegaly bowel sounds are present, Rectal exam not done today extremities no clubbing cyanosis or edema skin warm dry Assessment & Plan:  #681  63 year old female with known ulcerative proctosigmoiditis with exacerbation x 6 weeks #2 C. difficile colitis-currently on vancomycin and florastor Patient with persistent diarrhea abdominal cramping tenesmus and hematochezia Need to sort out whether this is all secondary to ulcerative colitis at this point versus refractory C. difficile superimposed on ulcerative colitis.  Plan; patient had labs on 12/02/2013 these were unremarkable We'll continue vancomycin 250 mg by mouth 4 times daily x1 more week to complete a total of 21 days She is to stay on floor store twice daily for another couple of weeks Add Bentyl 10 mg one half hour a.c. and at bedtime Zofran 4 mg every 6 hours when necessary for nausea Continue Canasa thousand milligrams each bedtime Repeat stool for C. difficile by PCR Cancel CT scan of the abdomen and pelvis Proceed to colonoscopy with Whitney Vance with biopsies. Procedure discussed in detail with the patient she is agreeable to proceed.  Addendum: Reviewed and agree with management. Beverley FiedlerJay M Pyrtle, MD

## 2013-12-07 ENCOUNTER — Other Ambulatory Visit: Payer: 59

## 2013-12-07 DIAGNOSIS — K51919 Ulcerative colitis, unspecified with unspecified complications: Secondary | ICD-10-CM

## 2013-12-07 DIAGNOSIS — A0472 Enterocolitis due to Clostridium difficile, not specified as recurrent: Secondary | ICD-10-CM

## 2013-12-07 DIAGNOSIS — R197 Diarrhea, unspecified: Secondary | ICD-10-CM

## 2013-12-08 ENCOUNTER — Encounter: Payer: Self-pay | Admitting: Internal Medicine

## 2013-12-09 LAB — CLOSTRIDIUM DIFFICILE BY PCR: CDIFFPCR: NOT DETECTED

## 2013-12-12 ENCOUNTER — Telehealth: Payer: Self-pay

## 2013-12-12 NOTE — Telephone Encounter (Signed)
Message copied by Evalee JeffersonMCKEW, Tavion Senkbeil A on Mon Dec 12, 2013  2:04 PM ------      Message from: Beverley FiedlerPYRTLE, JAY M      Created: Mon Dec 12, 2013 12:01 PM       Repeat C. difficile negative, proceed to colonoscopy as recently scheduled ------

## 2013-12-12 NOTE — Telephone Encounter (Signed)
Patient advised of results and plan of care

## 2013-12-12 NOTE — Telephone Encounter (Signed)
Left message to call.

## 2013-12-13 ENCOUNTER — Encounter: Payer: Self-pay | Admitting: Physician Assistant

## 2013-12-14 ENCOUNTER — Encounter: Payer: Self-pay | Admitting: Internal Medicine

## 2013-12-14 ENCOUNTER — Ambulatory Visit (AMBULATORY_SURGERY_CENTER): Payer: 59 | Admitting: Internal Medicine

## 2013-12-14 ENCOUNTER — Ambulatory Visit (HOSPITAL_COMMUNITY): Payer: 59

## 2013-12-14 VITALS — BP 127/95 | HR 71 | Temp 98.0°F | Resp 24 | Ht 63.0 in | Wt 207.0 lb

## 2013-12-14 DIAGNOSIS — R1030 Lower abdominal pain, unspecified: Secondary | ICD-10-CM

## 2013-12-14 DIAGNOSIS — K5289 Other specified noninfective gastroenteritis and colitis: Secondary | ICD-10-CM

## 2013-12-14 DIAGNOSIS — R197 Diarrhea, unspecified: Secondary | ICD-10-CM

## 2013-12-14 DIAGNOSIS — K5150 Left sided colitis without complications: Secondary | ICD-10-CM

## 2013-12-14 DIAGNOSIS — K5190 Ulcerative colitis, unspecified, without complications: Secondary | ICD-10-CM

## 2013-12-14 DIAGNOSIS — R109 Unspecified abdominal pain: Secondary | ICD-10-CM

## 2013-12-14 MED ORDER — BUDESONIDE 9 MG PO TB24: 1.0000 | ORAL_TABLET | Freq: Every day | ORAL | Status: DC

## 2013-12-14 MED ORDER — MESALAMINE 1.2 G PO TBEC
4.8000 g | DELAYED_RELEASE_TABLET | Freq: Every day | ORAL | Status: DC
Start: 2013-12-14 — End: 2014-01-24

## 2013-12-14 MED ORDER — SODIUM CHLORIDE 0.9 % IV SOLN: 500.0000 mL | INTRAVENOUS | Status: DC

## 2013-12-14 NOTE — Progress Notes (Signed)
Pt. Stated when she went to empty her bladder before disharge, she noted bowl full of bloody liquid.  Dr. Rhea BeltonPyrtle notified.  Stated after biopsies he Was not surprised.  Pt. Given this information and will call back if she believes bleeding is increasing.

## 2013-12-14 NOTE — Progress Notes (Signed)
Called to room to assist during endoscopic procedure.  Patient ID and intended procedure confirmed with present staff. Received instructions for my participation in the procedure from the performing physician.  

## 2013-12-14 NOTE — Patient Instructions (Signed)
YOU HAD AN ENDOSCOPIC PROCEDURE TODAY AT THE [facility]: Refer to the procedure report that was given to you for any specific questions about what was found during the examination.  If the procedure report does not answer your questions, please call your gastroenterologist to clarify.  If you requested that your care partner not be given the details of your procedure findings, then the procedure report has been included in a sealed envelope for you to review at your convenience later.  YOU SHOULD EXPECT: Some feelings of bloating in the abdomen. Passage of more gas than usual.  Walking can help get rid of the air that was put into your GI tract during the procedure and reduce the bloating. If you had a lower endoscopy (such as a colonoscopy or flexible sigmoidoscopy) you may notice spotting of blood in your stool or on the toilet paper. If you underwent a bowel prep for your procedure, then you may not have a normal bowel movement for a few days.  DIET: Your first meal following the procedure should be a light meal and then it is ok to progress to your normal diet.  A half-sandwich or bowl of soup is an example of a good first meal.  Heavy or fried foods are harder to digest and may make you feel nauseous or bloated.  Likewise meals heavy in dairy and vegetables can cause extra gas to form and this can also increase the bloating.  Drink plenty of fluids but you should avoid alcoholic beverages for 24 hours.  ACTIVITY: Your care partner should take you home directly after the procedure.  You should plan to take it easy, moving slowly for the rest of the day.  You can resume normal activity the day after the procedure however you should NOT DRIVE or use heavy machinery for 24 hours (because of the sedation medicines used during the test).    SYMPTOMS TO REPORT IMMEDIATELY: A gastroenterologist can be reached at any hour.  During normal business hours, 8:30 AM to 5:00 PM Monday through Friday,  call [phone].  After hours and on weekends, please call the GI answering service at [phone] who will take a message and have the physician on call contact you.   Following lower endoscopy (colonoscopy or flexible sigmoidoscopy):  Excessive amounts of blood in the stool  Significant tenderness or worsening of abdominal pains  Swelling of the abdomen that is new, acute  Fever of 100F or higher   FOLLOW UP: If any biopsies were taken you will be contacted by phone or by letter within the next 1-3 weeks.  Call your gastroenterologist if you have not heard about the biopsies in 3 weeks.  Our staff will call the home number listed on your records the next business day following your procedure to check on you and address any questions or concerns that you may have at that time regarding the information given to you following your procedure. This is a courtesy call and so if there is no answer at the home number and we have not heard from you through the emergency physician on call, we will assume that you have returned to your regular daily activities without incident.  SIGNATURES/CONFIDENTIALITY: You and/or your care partner have signed paperwork which will be entered into your electronic medical record.  These signatures attest to the fact that that the information above on your After Visit Summary has been reviewed and is understood.  Full responsibility of the confidentiality of   this discharge information lies with you and/or your care-partner.  Await biopsy results.  Begin Uceris 9mg . Daily fo 9 weks.  Lialda 4.8gm. Daily.

## 2013-12-14 NOTE — Op Note (Signed)
 [facility] [address] [place], [id number]   COLONOSCOPY PROCEDURE REPORT  PATIENT: Whitney Vance, Whitney W.  MR#: [id number] BIRTHDATE: 1950/06/26 , 62  yrs. old GENDER: Female ENDOSCOPIST: Beverley FiedlerJay M Azarius Lambson, MD REFERRED BY: Betty SwazilandJordan, MD PROCEDURE DATE:  12/14/2013 PROCEDURE:   Colonoscopy with biopsy First Screening Colonoscopy - Avg.  risk and is 50 yrs.  old or older - No.  Prior Negative Screening - Now for repeat screening. N/A  History of Adenoma - Now for follow-up colonoscopy & has been > or = to 3 yrs.  N/A  Polyps Removed Today? No.  Recommend repeat exam, <10 yrs? No. ASA CLASS:   Class III INDICATIONS:High risk patient with previously diagnosed UC and Recent rectal bleeding, lower abdominal pain, diarrhea, recent C. difficile colitis. MEDICATIONS: MAC sedation, administered by CRNA and propofol (Diprivan) 250mg  IV  DESCRIPTION OF PROCEDURE:   After the risks benefits and alternatives of the procedure were thoroughly explained, informed consent was obtained.  A digital rectal exam revealed external hemorrhoids.   The LB PFC-H190 [id number]  endoscope was introduced through the anus and advanced to the terminal ileum which was intubated for a short distance. No adverse events experienced. The quality of the prep was good, using MoviPrep  The instrument was then slowly withdrawn as the colon was fully examined.  COLON FINDINGS: The mucosa appeared normal in the terminal ileum. Normal mucosa in the cecum, patchy, very mild colitis in the ascending colon, with return to normal mucosa throughout the transverse colon. Moderate colitis was found in the descending colon, sigmoid colon and rectum extending from the dentate line to approximately 50 cm. The mucosa was congested, erythematous, friable and had superficial ulcers, loss of vascularity and granularity.  This is consistent with IBD.  Multiple biopsies were performed in the right and left colon using cold  forceps. Retroflexed views revealed no abnormalities. The time to cecum=6 minutes 08 seconds.  Withdrawal time=12 minutes 17 seconds.  The scope was withdrawn and the procedure completed.  COMPLICATIONS: There were no complications.   ENDOSCOPIC IMPRESSION: 1.   Normal mucosa in the terminal ileum 2.   Moderate left sided colitis, mild colitis in the ascending colon, multiple biopsies were performed using cold forceps  RECOMMENDATIONS: 1.  Await biopsy results 2.  Begin Uceris 9 mg daily x 9 week.  Begin Lialda 4.8 g daily. Return to clinic in 6-8 weeks.   eSigned:  Beverley FiedlerJay M Ellanor Feuerstein, MD 12/14/2013 2:25 PM   cc: The Patient; Betty SwazilandJordan, MD   PATIENT NAME:  Whitney Vance, Whitney W. MR#: [id number]

## 2013-12-14 NOTE — Progress Notes (Signed)
A/ox3 pleased with MAC, report to Jane RN 

## 2013-12-15 ENCOUNTER — Telehealth: Payer: Self-pay | Admitting: *Deleted

## 2013-12-15 NOTE — Telephone Encounter (Signed)
  Follow up Call-  Call back number 12/14/2013  Post procedure Call Back phone  # [phone]  Permission to leave phone message Yes     Patient questions:  Do you have a fever, pain , or abdominal swelling? No. Pain Score  0 *  Have you tolerated food without any problems? Yes.    Have you been able to return to your normal activities? Yes.    Do you have any questions about your discharge instructions: Diet   No. Medications  No. Follow up visit  No.  Do you have questions or concerns about your Care? No.  Actions: * If pain score is 4 or above: No action needed, pain <4.

## 2013-12-20 ENCOUNTER — Encounter: Payer: Self-pay | Admitting: Internal Medicine

## 2013-12-21 ENCOUNTER — Ambulatory Visit (HOSPITAL_COMMUNITY): Payer: 59

## 2014-01-10 ENCOUNTER — Telehealth: Payer: Self-pay | Admitting: Internal Medicine

## 2014-01-10 NOTE — Telephone Encounter (Signed)
Patient is scheduled for 02/21/14 9:15.  I spoke with her and she is aware

## 2014-01-18 ENCOUNTER — Other Ambulatory Visit: Payer: Self-pay

## 2014-01-18 ENCOUNTER — Telehealth: Payer: Self-pay | Admitting: Internal Medicine

## 2014-01-18 DIAGNOSIS — R197 Diarrhea, unspecified: Secondary | ICD-10-CM

## 2014-01-18 NOTE — Telephone Encounter (Signed)
Spoke with the patient. She has had 7 bowel movements today. She calls because her symptoms are getting worse. She has bloody stool, urgency and has become sore in the rectal area. Her medication list is correct. No fever or recent ATB's. She is scheduled for follow up with you on 02/21/14. Please advise.

## 2014-01-18 NOTE — Telephone Encounter (Signed)
Confirmed she is taking Uceris 9mg . She is unable to get to the lab today, but will go tomorrow.

## 2014-01-18 NOTE — Telephone Encounter (Signed)
Is she still on Uceris 9 mg daily, if so, please check stool c diff PCR stat. Continue Lialda 4.8 g daily May have to switch to prednisone in place of Uceris if c diff is neg (she had c diff earlier in the summer, so need to be sure this is not a recurrence)

## 2014-01-19 ENCOUNTER — Other Ambulatory Visit: Payer: 59

## 2014-01-19 DIAGNOSIS — R197 Diarrhea, unspecified: Secondary | ICD-10-CM

## 2014-01-20 ENCOUNTER — Encounter: Payer: Self-pay | Admitting: Gastroenterology

## 2014-01-20 ENCOUNTER — Telehealth: Payer: Self-pay | Admitting: Internal Medicine

## 2014-01-20 LAB — CLOSTRIDIUM DIFFICILE BY PCR: Toxigenic C. Difficile by PCR: DETECTED — CR

## 2014-01-20 NOTE — Telephone Encounter (Signed)
Needs followup call to see how she is doing and make treatment plan  Agree with ED eval now

## 2014-01-20 NOTE — Telephone Encounter (Signed)
Spoke with the patient. She has given a specimen to the lab to check for Cdiff. Today she has had multiple liquid bowel movements and she has vomited several times. She is getting dizzy when she stands up and now has a headache. Patient agrees to go to the ER for evaluation. She [place]

## 2014-01-23 ENCOUNTER — Other Ambulatory Visit: Payer: Self-pay

## 2014-01-23 ENCOUNTER — Telehealth: Payer: Self-pay

## 2014-01-23 MED ORDER — VANCOMYCIN HCL 250 MG PO CAPS: 250.0000 mg | ORAL_CAPSULE | Freq: Four times a day (QID) | ORAL | Status: DC

## 2014-01-23 NOTE — Telephone Encounter (Signed)
Patient aware of the results and the planned treatment. She will keep her 02/20/14 but understands to call if she acutely worsens or fails to improve.

## 2014-01-23 NOTE — Telephone Encounter (Signed)
Spoke with patient. She was given fluids. She will begin Vanc as ordered by Dr Rhea BeltonPyrtle.

## 2014-01-23 NOTE — Telephone Encounter (Signed)
Message copied by Evalee JeffersonMCKEW, Torrance Stockley A on Mon Jan 23, 2014  9:49 AM ------      Message from: Beverley FiedlerPYRTLE, JAY M      Created: Mon Jan 23, 2014  9:45 AM       Four times daily ------

## 2014-01-24 ENCOUNTER — Encounter (HOSPITAL_COMMUNITY): Payer: Self-pay | Admitting: Emergency Medicine

## 2014-01-24 ENCOUNTER — Telehealth: Payer: Self-pay | Admitting: Internal Medicine

## 2014-01-24 ENCOUNTER — Inpatient Hospital Stay (HOSPITAL_COMMUNITY)
Admission: EM | Admit: 2014-01-24 | Discharge: 2014-01-30 | DRG: 372 | Disposition: A | Discharge status: Home / Self Care | Payer: 59 | Attending: Internal Medicine | Admitting: Internal Medicine

## 2014-01-24 DIAGNOSIS — K219 Gastro-esophageal reflux disease without esophagitis: Secondary | ICD-10-CM | POA: Diagnosis present

## 2014-01-24 DIAGNOSIS — E860 Dehydration: Secondary | ICD-10-CM

## 2014-01-24 DIAGNOSIS — E538 Deficiency of other specified B group vitamins: Secondary | ICD-10-CM

## 2014-01-24 DIAGNOSIS — K5150 Left sided colitis without complications: Secondary | ICD-10-CM

## 2014-01-24 DIAGNOSIS — A0472 Enterocolitis due to Clostridium difficile, not specified as recurrent: Principal | ICD-10-CM

## 2014-01-24 DIAGNOSIS — E876 Hypokalemia: Secondary | ICD-10-CM

## 2014-01-24 DIAGNOSIS — K449 Diaphragmatic hernia without obstruction or gangrene: Secondary | ICD-10-CM

## 2014-01-24 DIAGNOSIS — R112 Nausea with vomiting, unspecified: Secondary | ICD-10-CM | POA: Diagnosis present

## 2014-01-24 DIAGNOSIS — Z113 Encounter for screening for infections with a predominantly sexual mode of transmission: Secondary | ICD-10-CM

## 2014-01-24 DIAGNOSIS — J309 Allergic rhinitis, unspecified: Secondary | ICD-10-CM

## 2014-01-24 DIAGNOSIS — A0471 Enterocolitis due to Clostridium difficile, recurrent: Secondary | ICD-10-CM

## 2014-01-24 DIAGNOSIS — F329 Major depressive disorder, single episode, unspecified: Secondary | ICD-10-CM | POA: Diagnosis present

## 2014-01-24 DIAGNOSIS — Z7982 Long term (current) use of aspirin: Secondary | ICD-10-CM

## 2014-01-24 DIAGNOSIS — K625 Hemorrhage of anus and rectum: Secondary | ICD-10-CM

## 2014-01-24 DIAGNOSIS — F3289 Other specified depressive episodes: Secondary | ICD-10-CM | POA: Diagnosis present

## 2014-01-24 DIAGNOSIS — Z87891 Personal history of nicotine dependence: Secondary | ICD-10-CM | POA: Diagnosis not present

## 2014-01-24 DIAGNOSIS — J45909 Unspecified asthma, uncomplicated: Secondary | ICD-10-CM | POA: Diagnosis present

## 2014-01-24 LAB — URINALYSIS, ROUTINE W REFLEX MICROSCOPIC
BILIRUBIN URINE: NEGATIVE
Glucose, UA: NEGATIVE mg/dL
Hgb urine dipstick: NEGATIVE
KETONES UR: NEGATIVE mg/dL
NITRITE: NEGATIVE
Protein, ur: NEGATIVE mg/dL
Specific Gravity, Urine: 1.008 (ref 1.005–1.030)
UROBILINOGEN UA: 0.2 mg/dL (ref 0.0–1.0)
pH: 7 (ref 5.0–8.0)

## 2014-01-24 LAB — COMPREHENSIVE METABOLIC PANEL
ALK PHOS: 106 U/L (ref 39–117)
ALT: 11 U/L (ref 0–35)
ANION GAP: 14 (ref 5–15)
AST: 12 U/L (ref 0–37)
Albumin: 3.2 g/dL — ABNORMAL LOW (ref 3.5–5.2)
BUN: 8 mg/dL (ref 6–23)
CHLORIDE: 102 meq/L (ref 96–112)
CO2: 26 mEq/L (ref 19–32)
Calcium: 9.2 mg/dL (ref 8.4–10.5)
Creatinine, Ser: 0.62 mg/dL (ref 0.50–1.10)
GFR calc non Af Amer: >90 mL/min (ref >90)
Glucose, Bld: 92 mg/dL (ref 70–99)
POTASSIUM: 3.5 meq/L — AB (ref 3.7–5.3)
Sodium: 142 mEq/L (ref 137–147)
TOTAL PROTEIN: 7 g/dL (ref 6.0–8.3)

## 2014-01-24 LAB — URINE MICROSCOPIC-ADD ON

## 2014-01-24 LAB — CBC WITH DIFFERENTIAL/PLATELET
Basophils Absolute: 0.1 10*3/uL (ref 0.0–0.1)
Basophils Relative: 1 % (ref 0–1)
Eosinophils Absolute: 0.4 10*3/uL (ref 0.0–0.7)
Eosinophils Relative: 4 % (ref 0–5)
HEMATOCRIT: 38.3 % (ref 36.0–46.0)
Hemoglobin: 12.5 g/dL (ref 12.0–15.0)
LYMPHS ABS: 2.8 10*3/uL (ref 0.7–4.0)
Lymphocytes Relative: 32 % (ref 12–46)
MCH: 29.1 pg (ref 26.0–34.0)
MCHC: 32.6 g/dL (ref 30.0–36.0)
MCV: 89.1 fL (ref 78.0–100.0)
MONO ABS: 0.9 10*3/uL (ref 0.1–1.0)
Monocytes Relative: 10 % (ref 3–12)
NEUTROS PCT: 53 % (ref 43–77)
Neutro Abs: 4.6 10*3/uL (ref 1.7–7.7)
Platelets: 498 10*3/uL — ABNORMAL HIGH (ref 150–400)
RBC: 4.3 MIL/uL (ref 3.87–5.11)
RDW: 14.5 % (ref 11.5–15.5)
WBC: 8.6 10*3/uL (ref 4.0–10.5)

## 2014-01-24 LAB — MAGNESIUM: Magnesium: 2 mg/dL (ref 1.5–2.5)

## 2014-01-24 MED ORDER — PAROXETINE HCL 20 MG PO TABS
20.0000 mg | ORAL_TABLET | Freq: Every day | ORAL | Status: DC
  Administered 2014-01-24 – 2014-01-30 (×7): 20 mg via ORAL
  Filled 2014-01-24 (×7): qty 1

## 2014-01-24 MED ORDER — MESALAMINE 1.2 G PO TBEC
1.2000 g | DELAYED_RELEASE_TABLET | Freq: Every day | ORAL | Status: DC
  Filled 2014-01-24: qty 1

## 2014-01-24 MED ORDER — ONDANSETRON HCL 4 MG/2ML IJ SOLN: 4.0000 mg | Freq: Three times a day (TID) | INTRAMUSCULAR | Status: DC | PRN

## 2014-01-24 MED ORDER — ONDANSETRON HCL 4 MG PO TABS
4.0000 mg | ORAL_TABLET | Freq: Four times a day (QID) | ORAL | Status: DC | PRN
  Administered 2014-01-26: 4 mg via ORAL
  Filled 2014-01-24: qty 1

## 2014-01-24 MED ORDER — SODIUM CHLORIDE 0.9 % IV BOLUS (SEPSIS)
1000.0000 mL | Freq: Once | INTRAVENOUS | Status: AC
  Administered 2014-01-24: 1000 mL via INTRAVENOUS

## 2014-01-24 MED ORDER — SODIUM CHLORIDE 0.9 % IV SOLN
INTRAVENOUS | Status: DC
  Administered 2014-01-25 – 2014-01-30 (×9): via INTRAVENOUS
  Filled 2014-01-24 (×13): qty 1000

## 2014-01-24 MED ORDER — ALBUTEROL SULFATE (2.5 MG/3ML) 0.083% IN NEBU: 2.5000 mg | INHALATION_SOLUTION | RESPIRATORY_TRACT | Status: DC | PRN

## 2014-01-24 MED ORDER — MORPHINE SULFATE 2 MG/ML IJ SOLN: 2.0000 mg | INTRAMUSCULAR | Status: DC | PRN

## 2014-01-24 MED ORDER — VANCOMYCIN 50 MG/ML ORAL SOLUTION: 125.0000 mg | ORAL | Status: DC

## 2014-01-24 MED ORDER — MESALAMINE 1000 MG RE SUPP
1000.0000 mg | Freq: Every day | RECTAL | Status: DC
  Administered 2014-01-24 – 2014-01-29 (×6): 1000 mg via RECTAL
  Filled 2014-01-24 (×7): qty 1

## 2014-01-24 MED ORDER — SODIUM CHLORIDE 0.9 % IV SOLN
INTRAVENOUS | Status: AC
  Administered 2014-01-24: 18:00:00 via INTRAVENOUS

## 2014-01-24 MED ORDER — VANCOMYCIN 50 MG/ML ORAL SOLUTION: 125.0000 mg | Freq: Two times a day (BID) | ORAL | Status: DC

## 2014-01-24 MED ORDER — VANCOMYCIN 50 MG/ML ORAL SOLUTION
125.0000 mg | ORAL | Status: AC
  Administered 2014-01-24: 125 mg via ORAL
  Filled 2014-01-24: qty 2.5

## 2014-01-24 MED ORDER — POTASSIUM CHLORIDE CRYS ER 20 MEQ PO TBCR
40.0000 meq | EXTENDED_RELEASE_TABLET | Freq: Once | ORAL | Status: AC
  Administered 2014-01-24: 40 meq via ORAL
  Filled 2014-01-24: qty 2

## 2014-01-24 MED ORDER — HYDROMORPHONE HCL PF 1 MG/ML IJ SOLN: 1.0000 mg | INTRAMUSCULAR | Status: DC | PRN

## 2014-01-24 MED ORDER — OXYCODONE HCL 5 MG PO TABS
5.0000 mg | ORAL_TABLET | ORAL | Status: DC | PRN
  Administered 2014-01-24 – 2014-01-28 (×4): 5 mg via ORAL
  Filled 2014-01-24 (×4): qty 1

## 2014-01-24 MED ORDER — ACETAMINOPHEN 325 MG PO TABS
650.0000 mg | ORAL_TABLET | Freq: Four times a day (QID) | ORAL | Status: DC | PRN
  Administered 2014-01-25 – 2014-01-27 (×5): 650 mg via ORAL
  Filled 2014-01-24 (×4): qty 2

## 2014-01-24 MED ORDER — ONDANSETRON HCL 4 MG/2ML IJ SOLN: 4.0000 mg | Freq: Four times a day (QID) | INTRAMUSCULAR | Status: DC | PRN

## 2014-01-24 MED ORDER — VANCOMYCIN 50 MG/ML ORAL SOLUTION
125.0000 mg | Freq: Four times a day (QID) | ORAL | Status: DC
  Administered 2014-01-24 – 2014-01-25 (×4): 125 mg via ORAL
  Filled 2014-01-24 (×7): qty 2.5

## 2014-01-24 MED ORDER — FAMOTIDINE 20 MG PO TABS
20.0000 mg | ORAL_TABLET | Freq: Every day | ORAL | Status: DC
  Administered 2014-01-24 – 2014-01-26 (×3): 20 mg via ORAL
  Filled 2014-01-24 (×3): qty 1

## 2014-01-24 MED ORDER — ACETAMINOPHEN 650 MG RE SUPP
650.0000 mg | Freq: Four times a day (QID) | RECTAL | Status: DC | PRN
Start: 2014-01-24 — End: 2014-01-30

## 2014-01-24 MED ORDER — MESALAMINE 1.2 G PO TBEC
2.4000 g | DELAYED_RELEASE_TABLET | Freq: Two times a day (BID) | ORAL | Status: DC
  Administered 2014-01-24 – 2014-01-30 (×12): 2.4 g via ORAL
  Filled 2014-01-24 (×18): qty 2

## 2014-01-24 MED ORDER — ONDANSETRON HCL 4 MG/2ML IJ SOLN
4.0000 mg | Freq: Once | INTRAMUSCULAR | Status: AC
  Administered 2014-01-24: 4 mg via INTRAVENOUS
  Filled 2014-01-24: qty 2

## 2014-01-24 MED ORDER — SACCHAROMYCES BOULARDII 250 MG PO CAPS
250.0000 mg | ORAL_CAPSULE | Freq: Two times a day (BID) | ORAL | Status: DC
  Administered 2014-01-24 – 2014-01-30 (×12): 250 mg via ORAL
  Filled 2014-01-24 (×13): qty 1

## 2014-01-24 MED ORDER — VANCOMYCIN 50 MG/ML ORAL SOLUTION: 125.0000 mg | Freq: Every day | ORAL | Status: DC

## 2014-01-24 MED ORDER — POTASSIUM CHLORIDE CRYS ER 20 MEQ PO TBCR: 40.0000 meq | EXTENDED_RELEASE_TABLET | Freq: Once | ORAL | Status: DC

## 2014-01-24 NOTE — ED Provider Notes (Signed)
CSN: [id number]     Arrival date & time 01/24/14  1100 History   First MD Initiated Contact with Patient 01/24/14 1118     Chief Complaint  Patient presents with  . Diarrhea  . Abdominal Pain  . C.Diff      (Consider location/radiation/quality/duration/timing/severity/associated sxs/prior Treatment) HPI Comments: Patient with history of ulcerative colitis with recent course of oral vancomycin for C. difficile colitis in 11/2013 that she developed after taking oral antibiotics -- presents with recurrent C. difficile infection over the past week with associated watery, bloody stools and occasional nausea and vomiting. Patient is followed by Dr. Rhea BeltonPyrtle. Patient has been feeling very poorly at home and has been feeling progressively worse over the past week. She is not eating or drinking well at home. She has had chills but no documented fever. Patient states that the symptoms are worse overnight and she has constant diarrhea. After consultation with her gastroenterologist, patient was asked to come to the emergency department for probable admission. She has generalized abdominal pain. No urinary symptoms. Aggravating factors: none. Alleviating factors: none.    Patient is a 63 y.o. female presenting with diarrhea and abdominal pain. The history is provided by the patient, medical records and a relative.  Diarrhea Associated symptoms: abdominal pain and vomiting   Associated symptoms: no fever, no headaches and no myalgias   Abdominal Pain Associated symptoms: diarrhea, nausea and vomiting   Associated symptoms: no chest pain, no cough, no dysuria, no fever and no sore throat     Past Medical History  Diagnosis Date  . Vitamin B 12 deficiency   . Ulcerative colitis   . Asthma   . Hiatal hernia   . GERD (gastroesophageal reflux disease)   . H/O Clostridium difficile infection     has it now 11/16/2013  . Depression    Past Surgical History  Procedure Laterality Date  . Foot surgery       09/2013  . Vaginal hysterectomy    . Appendectomy    . Cesarean section     Family History  Problem Relation Age of Onset  . Ulcerative colitis Mother    History  Substance Use Topics  . Smoking status: Former Smoker    Quit date: 06/09/1986  . Smokeless tobacco: Never Used  . Alcohol Use: No   OB History   Grav Para Term Preterm Abortions TAB SAB Ect Mult Living                 Review of Systems  Constitutional: Negative for fever.  HENT: Negative for rhinorrhea and sore throat.   Eyes: Negative for redness.  Respiratory: Negative for cough.   Cardiovascular: Negative for chest pain.  Gastrointestinal: Positive for nausea, vomiting, abdominal pain, diarrhea and blood in stool.  Genitourinary: Negative for dysuria.  Musculoskeletal: Negative for myalgias.  Skin: Negative for rash.  Neurological: Negative for headaches.     Allergies  Demerol  Home Medications   Prior to Admission medications   Medication Sig Start Date End Date Taking? Authorizing Provider  aspirin EC 81 MG tablet Take 81 mg by mouth daily.   Yes Historical Provider, MD  Budesonide (UCERIS) 9 MG TB24 Take 9 mg by mouth daily.   Yes Historical Provider, MD  Glucosamine-Chondroit-Vit C-Mn (GLUCOSAMINE 1500 COMPLEX) CAPS Take by mouth 2 (two) times daily.   Yes Historical Provider, MD  mesalamine (CANASA) 1000 MG suppository Place 1,000 mg rectally at bedtime.   Yes Historical Provider, MD  mesalamine (  LIALDA) 1.2 G EC tablet Take 1.2 g by mouth daily with breakfast.   Yes Historical Provider, MD  omeprazole (PRILOSEC) 20 MG capsule Take 20 mg by mouth daily.   Yes Historical Provider, MD  PARoxetine (PAXIL) 20 MG tablet Take 20 mg by mouth daily.   Yes Historical Provider, MD  vancomycin (VANCOCIN) 250 MG capsule Take 250 mg by mouth 4 (four) times daily.   Yes Historical Provider, MD   BP 135/75  Pulse 105  Temp(Src) 98.1 F (36.7 C) (Oral)  Resp 20  SpO2 95%  Physical Exam  Nursing note  and vitals reviewed. Constitutional: She appears well-developed and well-nourished.  HENT:  Head: Normocephalic and atraumatic.  Oral mucosa dry  Eyes: Conjunctivae are normal. Right eye exhibits no discharge. Left eye exhibits no discharge.  No conjunctival pallor  Neck: Normal range of motion. Neck supple.  Cardiovascular: Regular rhythm and normal heart sounds.   Mild tachycardia  Pulmonary/Chest: Effort normal and breath sounds normal. No respiratory distress. She has no wheezes. She has no rales.  Abdominal: Soft. Bowel sounds are normal. She exhibits no distension. There is tenderness (mild generalized tenderness, worse lower abdomen). There is no rebound and no guarding.  Neurological: She is alert.  Skin: Skin is warm and dry.  Psychiatric: She has a normal mood and affect.    ED Course  Procedures (including critical care time) Labs Review Labs Reviewed  CBC WITH DIFFERENTIAL - Abnormal; Notable for the following:    Platelets 498 (*)    All other components within normal limits  COMPREHENSIVE METABOLIC PANEL - Abnormal; Notable for the following:    Potassium 3.5 (*)    Albumin 3.2 (*)    Total Bilirubin <0.2 (*)    All other components within normal limits  URINALYSIS, ROUTINE W REFLEX MICROSCOPIC - Abnormal; Notable for the following:    APPearance CLOUDY (*)    Leukocytes, UA SMALL (*)    All other components within normal limits  URINE MICROSCOPIC-ADD ON    Imaging Review No results found.   EKG Interpretation None      Patient seen and examined. Work-up initiated. Medications ordered.   Vital signs reviewed and are as follows: BP 135/75  Pulse 105  Temp(Src) 98.1 F (36.7 C) (Oral)  Resp 20  SpO2 95%  Patient was discussed with and seen by Dr. Lynelle Doctor. GI PA spoke with Dr. Lynelle Doctor.   I spoke with Dr. Janee Morn who will see and evaluate for admission.    MDM   Final diagnoses:  Clostridium difficile colitis  Dehydration   Admit for symptom  control, dehydration. Patient appears dry on exam.     Renne Crigler, PA-C 01/24/14 1535

## 2014-01-24 NOTE — Progress Notes (Signed)
UR completed 

## 2014-01-24 NOTE — ED Provider Notes (Signed)
Pt reports diarrhea about 10-12 times a day that is loose and watery for the past 1 1/2 weeks. Has nausea with vomiting about once daily. Has diffuse lower abdominal pain before she has diarrhea she gets a sharp pain. Patient states she had C. difficile in April but took a month to clear up. She states that Flagyl did not work, she had to take vancomycin. She states she had a positive C. difficile test yesterday. She reports her gastroenterologist ordered and he be admitted today.  Patient is alert and cooperative, she is very flat affect. Abdominal exam deferred to PA.  13:50 Shanda BumpsJessica, GI PA called me and states Dr Rhea BeltonPyrtle wants patient to be admitted for IV fluids.    Medical screening examination/treatment/procedure(s) were conducted as a shared visit with non-physician practitioner(s) and myself.  I personally evaluated the patient during the encounter.   EKG Interpretation None       Devoria AlbeIva Tyja Gortney, MD, Armando GangFACEP   Ward GivensIva L Kaegan Stigler, MD 01/24/14 [id number]

## 2014-01-24 NOTE — H&P (Signed)
[facility] History and Physical  Whitney Vance DOB: 02/16/1951 DOA: 01/24/2014  Referring physician: Dr Devoria Albe PCP: [place], BETTY G, MD /gastroenterologist: Dr. Rhea Belton  Chief Complaint: Diarrhea  HPI: Whitney Vance is a 63 y.o. female  With history of ulcerative colitis, depression, asthma, gastroesophageal reflux disease, depression, who was diagnosed with C. difficile colitis in June of 2015 and was treated with a course of Flagyl and oral vancomycin for a total of 21 days who presents to the ED with a one and a half week history of persistent loose stools approximately 15 watery stools per day with some associated nausea and nonbloody emesis as well as worsening bright red blood per rectum over the past week. Patient also endorses lower abdominal pain which has been constant in nature and becomes a shooting pain across her abdomen whenever patient is having a bowel movement. Patient also endorses generalized weakness fatigue chills and subjective fevers. Patient denies any constipation, no dysuria, no chest pain, no shortness of breath, no cough. Patient stated that she didn't contact the gastroenterologist office about his symptoms a stool sample was taken 5 days prior to admission and she was called one day prior to admission that this stool was also positive for C. difficile colitis. Oral vancomycin was called in to the patient's pharmacy and patient took 2 tablets yesterday and one this morning. Patient stated that his symptoms are not improving to the point where she was having a bowel movement every 30 minutes and subsequently called the gastroenterologist office. Patient was subsequently redirected to present to the emergency room for further evaluation and management. Patient was seen in the emergency room, comprehensive metabolic profile done a potassium of 3.5 albumin of 3.2 total bilirubin less than 0.4 vessels within normal limits. CBC had a platelet count of  498 otherwise was within normal limits. Urinalysis was cloudy nitrite negative small leukocytes 7-10 WBCs. We were called to admit the patient for further evaluation and management.    Review of Systems: As per HPI Constitutional:  No weight loss, night sweats, Fevers, chills, fatigue.  HEENT:  No headaches, Difficulty swallowing,Tooth/dental problems,Sore throat,  No sneezing, itching, ear ache, nasal congestion, post nasal drip,  Cardio-vascular:  No chest pain, Orthopnea, PND, swelling in lower extremities, anasarca, dizziness, palpitations  GI:  No heartburn, indigestion, abdominal pain, nausea, vomiting, diarrhea, change in bowel habits, loss of appetite  Resp:  No shortness of breath with exertion or at rest. No excess mucus, no productive cough, No non-productive cough, No coughing up of blood.No change in color of mucus.No wheezing.No chest wall deformity  Skin:  no rash or lesions.  GU:  no dysuria, change in color of urine, no urgency or frequency. No flank pain.  Musculoskeletal:  No joint pain or swelling. No decreased range of motion. No back pain.  Psych:  No change in mood or affect. No depression or anxiety. No memory loss.   Past Medical History  Diagnosis Date  . Vitamin B 12 deficiency   . Ulcerative colitis   . Hiatal hernia   . GERD (gastroesophageal reflux disease)   . H/O Clostridium difficile infection 11/2013, 01/2014    has it now 11/16/2013  . Depression   . Asthma    Past Surgical History  Procedure Laterality Date  . Foot surgery  09/28/13  . Vaginal hysterectomy    . Appendectomy    . Cesarean section     Social History:  reports that she quit smoking  about 27 years ago. Her smoking use included Cigarettes. She smoked 0.00 packs per day for 20 years. She has never used smokeless tobacco. She reports that she does not drink alcohol or use illicit drugs.  Allergies  Allergen Reactions  . Demerol [Meperidine] Other (See Comments)     Hallucinations / nervousness / falling spells    Family History  Problem Relation Age of Onset  . Ulcerative colitis Mother      Prior to Admission medications   Medication Sig Start Date End Date Taking? Authorizing Provider  aspirin EC 81 MG tablet Take 81 mg by mouth daily.   Yes Historical Provider, MD  Budesonide (UCERIS) 9 MG TB24 Take 9 mg by mouth daily.   Yes Historical Provider, MD  Glucosamine-Chondroit-Vit C-Mn (GLUCOSAMINE 1500 COMPLEX) CAPS Take by mouth 2 (two) times daily.   Yes Historical Provider, MD  mesalamine (CANASA) 1000 MG suppository Place 1,000 mg rectally at bedtime.   Yes Historical Provider, MD  mesalamine (LIALDA) 1.2 G EC tablet Take 1.2 g by mouth daily with breakfast.   Yes Historical Provider, MD  omeprazole (PRILOSEC) 20 MG capsule Take 20 mg by mouth daily.   Yes Historical Provider, MD  PARoxetine (PAXIL) 20 MG tablet Take 20 mg by mouth daily.   Yes Historical Provider, MD  vancomycin (VANCOCIN) 250 MG capsule Take 250 mg by mouth 4 (four) times daily.   Yes Historical Provider, MD   Physical Exam: Filed Vitals:   01/24/14 1115 01/24/14 1336 01/24/14 1354 01/24/14 1605  BP: 135/75 115/71  132/66  Pulse: 105 77  83  Temp: 98.1 F (36.7 C)   98.2 F (36.8 C)  TempSrc: Oral   Oral  Resp: 20  18 18   SpO2: 95% 95%  95%    Wt Readings from Last 3 Encounters:  12/14/13 93.895 kg (207 lb)  12/05/13 93.895 kg (207 lb)  11/16/13 93.895 kg (207 lb)    General:   well-developed well-nourished laying on the gurney in no acute cardiopulmonary distress.  Eyes: PERRLA, EOMI, normal lids, irises & conjunctiva ENT: grossly normal hearing, lips & tongue. Dry mucous membranes  Neck: no LAD, masses or thyromegaly Cardiovascular: RRR, no m/r/g. No LE edema Respiratory: CTA bilaterally, no w/r/r. Normal respiratory effort. Abdomen: soft, nondistended, positive bowel sounds, some tenderness to palpation in the left and right lower quadrants.  Skin: no rash  or induration seen on limited exam Musculoskeletal: grossly normal tone BUE/BLE Psychiatric: grossly normal mood and affect, speech fluent and appropriate Neurologic:  CN2-12 oriented x3. Cranial nerves II through XII are grossly intact. Sensation is intact. Visual fields are intact. No focal deficits. grossly non-focal.          Labs on Admission:  Basic Metabolic Panel:  Recent Labs Lab 01/24/14 1225  NA 142  K 3.5*  CL 102  CO2 26  GLUCOSE 92  BUN 8  CREATININE 0.62  CALCIUM 9.2   Liver Function Tests:  Recent Labs Lab 01/24/14 1225  AST 12  ALT 11  ALKPHOS 106  BILITOT <0.2*  PROT 7.0  ALBUMIN 3.2*   No results found for this basename: LIPASE, AMYLASE,  in the last 168 hours No results found for this basename: AMMONIA,  in the last 168 hours CBC:  Recent Labs Lab 01/24/14 1225  WBC 8.6  NEUTROABS 4.6  HGB 12.5  HCT 38.3  MCV 89.1  PLT 498*   Cardiac Enzymes: No results found for this basename: CKTOTAL, CKMB, CKMBINDEX, TROPONINI,  in the last 168 hours  BNP (last 3 results) No results found for this basename: PROBNP,  in the last 8760 hours CBG: No results found for this basename: GLUCAP,  in the last 168 hours  Radiological Exams on Admission: No results found.  EKG: None  Assessment/Plan Principal Problem:   Recurrent colitis due to Clostridium difficile Active Problems:   BRBPR (bright red blood per rectum)   B12 DEFICIENCY   ASTHMA   GERD   HIATAL HERNIA   ULCERATIVE COLITIS, LEFT SIDED   Hypokalemia  #1 recurrent colitis due to C. difficile Patient with recent positive C. difficile sample done at the [facility] office on 01/19/2014. Patient with multiple loose stools approximately 15 a day. Will admit the patient to MedSurg. Check a magnesium level. Place on clear liquids. IV fluids. Anti-medics. Supportive care. We'll place on a oral vancomycin pulse taper over 4 weeks. Will hold patient's budesonide and aspirin. GI consultation pending.  Follow.  #2 bright red blood per rectum Likely secondary to problem #1 versus ulcerative colitis flare. Follow H&H. Hold patient's aspirin. Hold patient's budesonide. Continue patient's mesalamine. IV fluids. Supportive care. We'll place patient on oral vancomycin pulse taper. GI consultation pending.  #3 gastroesophageal reflux disease Place on Pepcid.  #4 history of ulcerative colitis Will hold patient's budesonide secondary to problem #1. Continue mesalamine. GI consultation pending.  #5 hypokalemia Likely secondary to GI losses. Check a magnesium level. Replete.  #6 asthma  Stable. Albuterol nebs as needed.  #7 prophylaxis Pepcid for GI prophylaxis. SCDs for DVT prophylaxis.    Code Status: Full DVT Prophylaxis: SCDs. Family Communication: Updated patient and friend at bedside. Disposition Plan: Admit to MedSurg bed.  Time spent: 70 mins  [facility] MD [facility] Pager [phone]  **Disclaimer: This note may have been dictated with voice recognition software. Similar sounding words can inadvertently be transcribed and this note may contain transcription errors which may not have been corrected upon publication of note.**

## 2014-01-24 NOTE — Telephone Encounter (Signed)
Patient has an aching abdominal pain. It awakens her from sleep. She rushes to the bathroom and has bloody diarrhea. She will lie back down, doze a little and is again awakened by the pain. The process repeats itself. She is very tired, has chills then sweating, has vomited a few times and feels nauseous. She did start the Vancomycin and has had 3 doses so far.

## 2014-01-24 NOTE — ED Notes (Signed)
Pt has been diagnosed with C.Diff. ot sent over by PCP for admission. Pt c/o lower abd pain and diarrhea.

## 2014-01-24 NOTE — ED Notes (Signed)
Patient given chicken broth and apple juice.

## 2014-01-24 NOTE — Consult Note (Signed)
[facility] Consult: 4:15 PM 01/24/2014  LOS: 0 days    Referring Provider: Dr Janee Morn Primary Care Physician:  [place], BETTY G, MD Primary Gastroenterologist:  Dr. Rhea Belton     Reason for Consultation:  Refractory diarrhea. Recurrent C diff   HPI: Whitney Vance is a 63 y.o. female with past medical history of ulcerative proctosigmoiditis, GERD, B12 deficiency who was previously followed by Dr. Sheryn Bison.  Established with Dr Rhea Belton in 11/2013 at which time was on no meds for UC.  Many years of proctosigmoiditis and required oral and per rectum 5-ASA as well as cort enemas.  Sxs seemed to improve but had occasional flares after eating red meat but would resolve quickly.  She was treated with abx for tonsillitis/URI several months ago. Also received abx at time of foot surgery 09/28/13. In mid to late May she developed abdominal pain and mucoid bloody stools.  Started having 6 or so stools per day.  Stool Cdiff was PCR+ per PMD and was Rx'd with Flagyl 500 mg tid for 10 days.  Was still having bloody diarrhea and Dr Rhea Belton started Canasa at bedtime and Vancomycin 250 qid.  Continued to have sxs of up to 10 stools per day still mixed with blood.  Treated with second 10 day course of Vanc, finishing up ~ early July.  Did well for about one month: solid, non-bloody stools 1 to 2 times per day.  No abdominal pain.   Underwent colonoscopy on 12/14/13 showing moderate left sided colitis, mild colitis in the ascending colon, multiple biopsies were performed using cold forceps.  Biopsy showed chronic, mild, active colitis.  She was started on Uceris 9 mg daily and Lialda 4.9 grams daily  About 10 days ago started having abdominal pain, cramps and eventually bloody diarrhea.  All of this accelerating in last several days.   Seen last Friday 8/14 at ED in [place] where she had CT, EKG, labwork.  Received IVF and sent home with hydrocodone for headache.  CT scan showed: No acute findings to explain etiology of patient's pain.  Prominent pericolonic vessels and pericolic lymph nodes involving the distal sigmoid colon, these findings could be chronic. No definite signs of active colitis. Still not doing well.  Called office and vanc was restarted yesterday for refractory diarrhea, abdominal pain, chills, sweats, nausea and vomiting.  Sent to ED today as sxs continue.  No falls, no dizziness, no bloody emesis.  Stools nearly all blood now.  In ED today: Hgb and WBCs normal.  Platelets elevated at 498 but were 516 12/02/13.  Potassium low at 3.5, renal function normal.  Dr Janee Morn is holding Uceris given recurrent C diff. Also on hold is her daily Omeprazole and 81ASA.   Cdiff PCR negative 12/07/13 but positive 01/19/14. Has not had repeat imaging.      Past Medical History  Diagnosis Date  . Vitamin B 12 deficiency   . Ulcerative colitis   . Hiatal hernia   . GERD (gastroesophageal reflux disease)   . H/O Clostridium difficile infection 11/2013, 01/2014  has it now 11/16/2013  . Depression   . Asthma     Past Surgical History  Procedure Laterality Date  . Foot surgery  09/28/13  . Vaginal hysterectomy    . Appendectomy    . Cesarean section      Prior to Admission medications   Medication Sig Start Date End Date Taking? Authorizing Provider  aspirin EC 81 MG tablet Take 81 mg by mouth daily.   Yes Historical Provider, MD  Budesonide (UCERIS) 9 MG TB24 Take 9 mg by mouth daily.   Yes Historical Provider, MD  Glucosamine-Chondroit-Vit C-Mn (GLUCOSAMINE 1500 COMPLEX) CAPS Take by mouth 2 (two) times daily.   Yes Historical Provider, MD  mesalamine (CANASA) 1000 MG suppository Place 1,000 mg rectally at bedtime.   Yes Historical Provider, MD  mesalamine (LIALDA) 1.2 G EC tablet Take 1.2 g by mouth daily  with breakfast.   Yes Historical Provider, MD  omeprazole (PRILOSEC) 20 MG capsule Take 20 mg by mouth daily.   Yes Historical Provider, MD  PARoxetine (PAXIL) 20 MG tablet Take 20 mg by mouth daily.   Yes Historical Provider, MD  vancomycin (VANCOCIN) 250 MG capsule Take 250 mg by mouth 4 (four) times daily.   Yes Historical Provider, MD    Scheduled Meds: . vancomycin  125 mg Oral QID   Followed by  . [START ON 01/31/2014] vancomycin  125 mg Oral BID   Followed by  . [START ON 02/08/2014] vancomycin  125 mg Oral Daily   Followed by  . [START ON 02/15/2014] vancomycin  125 mg Oral QODAY   Followed by  . [START ON 02/23/2014] vancomycin  125 mg Oral Q3 days  . vancomycin  125 mg Oral STAT   Infusions: . sodium chloride 0.9 % 1,000 mL infusion     PRN Meds: HYDROmorphone (DILAUDID) injection, ondansetron (ZOFRAN) IV   Allergies as of 01/24/2014 - Review Complete 01/24/2014  Allergen Reaction Noted  . Demerol [meperidine] Other (See Comments) 11/16/2013    Family History  Problem Relation Age of Onset  . Ulcerative colitis Mother     History   Social History  . Marital Status: Married    Spouse Name: N/A    Number of Children: N/A  . Years of Education: N/A   Occupational History  . 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 Smoking status: Former Smoker -- 20 years    Types: Cigarettes    Quit date: 06/09/1986  . Smokeless tobacco: Never Used  . Alcohol Use: No  . Drug Use: No  . Sexual Activity: Not on file   Other Topics Concern  . Not on file   Social History Narrative  . No narrative on file    REVIEW OF SYSTEMS: Constitutional:  5 # weight loss. Weak. ENT:  No nose bleeds Pulm:  No DOE or coughing CV:  No palpitations, no LE edema. No chest pain.  GU:  No hematuria, no frequency GI:  Per HPI Heme:  No hx anemia or unusual non-gi bleeding or excessive bruiising   Transfusions:  None ever Neuro:  No headaches, no peripheral tingling or  numbness Derm:  No itching, no rash or sores.  Endocrine:  No sweats or chills.  No polyuria or dysuria Immunization:  Not queried.  Travel:  None beyond local counties in last few months.    PHYSICAL EXAM: Vital signs in last 24 hours: Filed Vitals:   01/24/14 1605  BP: 132/66  Pulse: 83  Temp: 98.2 F (36.8 C)  Resp: 18   Wt Readings from Last 3 Encounters:  12/14/13 93.895 kg (207 lb)  12/05/13 93.895 kg (207 lb)  11/16/13 93.895 kg (207 lb)    General: pt looks pale, somewhat ill but not toxic.   Head:  No asymmetry or swelling  Eyes:  No icterus or pallor.  EOMI Ears:  Not HOH  Nose:  No congestion or discharge Mouth:  Clear, moist oral MM.  Neck:  No TMG, no JVD.  supple Lungs:  Clear bil.  No resp distress or cough Heart: RRR.  No MRG Abdomen:  Soft, BS active, slight distention.  No mass, no HSM, no hernias.   Rectal: deferred   Musc/Skeltl: no joint contracture or swellling Extremities:  No CCE  Neurologic:  Oriented x 3, good historian, no tremor, no gross deficits Skin:  No telangectsia, no sores or rash Tattoos:  None seen.  Nodes:  No cervical adenopathy   Psych:  Pleasant, relaxed, fluid speech.  Intake/Output from previous day:   Intake/Output this shift:    LAB RESULTS:  Recent Labs  01/24/14 1225  WBC 8.6  HGB 12.5  HCT 38.3  PLT 498*   BMET Lab Results  Component Value Date   NA 142 01/24/2014   NA 138 12/02/2013   NA 138 04/22/2007   K 3.5* 01/24/2014   K 4.0 12/02/2013   K 4.1 04/22/2007   CL 102 01/24/2014   CL 102 12/02/2013   CL 103 04/22/2007   CO2 26 01/24/2014   CO2 29 12/02/2013   CO2 29 04/22/2007   GLUCOSE 92 01/24/2014   GLUCOSE 95 12/02/2013   GLUCOSE 93 04/22/2007   BUN 8 01/24/2014   BUN 11 12/02/2013   BUN 9 04/22/2007   CREATININE 0.62 01/24/2014   CREATININE 0.7 12/02/2013   CREATININE 0.7 04/22/2007   CALCIUM 9.2 01/24/2014   CALCIUM 9.2 12/02/2013   CALCIUM 9.3 04/22/2007   LFT  Recent Labs  01/24/14 1225    PROT 7.0  ALBUMIN 3.2*  AST 12  ALT 11  ALKPHOS 106  BILITOT <0.2*   Urinalysis    Component Value Date/Time   COLORURINE YELLOW 01/24/2014 1226   APPEARANCEUR CLOUDY* 01/24/2014 1226   LABSPEC 1.008 01/24/2014 1226   PHURINE 7.0 01/24/2014 1226   GLUCOSEU NEGATIVE 01/24/2014 1226   HGBUR NEGATIVE 01/24/2014 1226   BILIRUBINUR NEGATIVE 01/24/2014 1226   KETONESUR NEGATIVE 01/24/2014 1226   PROTEINUR NEGATIVE 01/24/2014 1226   UROBILINOGEN 0.2 01/24/2014 1226   NITRITE NEGATIVE 01/24/2014 1226   LEUKOCYTESUR SMALL* 01/24/2014 1226    C-Diff + on 01/19/14   RADIOLOGY STUDIES: No results found.  ENDOSCOPIC STUDIES: 12/14/13   Colonoscopy  Dr Rhea Belton INDICATIONS:High risk patient with previously diagnosed UC and  Recent rectal bleeding, lower abdominal pain, diarrhea, recent C. difficile colitis. ENDOSCOPIC IMPRESSION:  1. Normal mucosa in the terminal ileum  2. Moderate left sided colitis, mild colitis in the ascending  colon, multiple biopsies were performed using cold forceps  RECOMMENDATIONS:  1. Await biopsy results  2. Begin Uceris 9 mg daily x 9 week. Begin Lialda 4.8 g daily.  Return to clinic in 6-8 weeks. Pathology: 1. Surgical [P], right colon, biopsy - CHRONIC MILDLY ACTIVE COLITIS CONSISTENT WITH INFLAMMATORY BOWEL DISEASE. NEGATIVE FOR DYSPLASIA. 2. Surgical [P], transverse, biopsy - BENIGN COLONIC MUCOSA. NEGATIVE FOR DYSPLASIA. NO ACTIVE INFLAMMATION. 3. Surgical [P], rectum, left colon, biopsy - CHRONIC MILDLY ACTIVE COLITIS CONSISTENT WITH  INFLAMMATORY BOWEL DISEASE, NEGATIVE FOR DYSPLASIA.   IMPRESSION:   *  Recurrent C diff in pt with left sided UC. Sxs improved for one month after completing 20 days of oral vancomycin but now has recurrence.   *  Hypokalemia. Has received oral supplement.    PLAN:     *  Taper dose of oral Vancomycin, Dr Janee Mornhompson has restarted and plans long taper. Continue Canasa PR, Lialda,  *  ? Restart budesonide and  Florastor. *  Fine now to have clears. Agree with additional IVF *  Agree with holding PPI and replacing with Pepcid.   Whitney MoccasinSarah Vance  01/24/2014, 4:15 PM Pager: [phone]      Attending physician's note   I have taken a history, examined the patient and reviewed the chart. I agree with the Advanced Practitioner's note, impression and recommendations.   Recurrent, relapsing C. diff is her primary problem. Vancomycin 125 mg qid for now. Restart Florastor and continue for at least 4 weeks beyond her antibiotic course for C. diff. Clear liquid diet for now. Proceed with ID consult for treatment recommendations and further mgmt of C diff.   UC with mild to moderate activity on recent colonoscopy. Continue Lialda and Canasa. Hold budesonide for now.    Meryl DareMalcolm T Stark, MD Clementeen GrahamFACG

## 2014-01-24 NOTE — ED Provider Notes (Signed)
See prior note   Ward GivensIva L Jerald Villalona, MD 01/24/14 [phone]

## 2014-01-24 NOTE — Telephone Encounter (Signed)
[facility]

## 2014-01-24 NOTE — Telephone Encounter (Signed)
Given her continued issues at home combined with left-sided ulcerative colitis complicated by C. difficile infection I recommend that she be admitted to the hospital via the emergency department She went to the ER on Friday where she received fluids but was sent home, she continues to struggle at home at this time I discussed the admission with Doug SouJessica Zehr, PA-C who will see her when she arrives

## 2014-01-24 NOTE — Progress Notes (Signed)
Contacted ED for report. Waiting on call back.

## 2014-01-25 DIAGNOSIS — K449 Diaphragmatic hernia without obstruction or gangrene: Secondary | ICD-10-CM

## 2014-01-25 DIAGNOSIS — A0472 Enterocolitis due to Clostridium difficile, not specified as recurrent: Principal | ICD-10-CM

## 2014-01-25 LAB — COMPREHENSIVE METABOLIC PANEL
ALT: 10 U/L (ref 0–35)
AST: 10 U/L (ref 0–37)
Albumin: 2.7 g/dL — ABNORMAL LOW (ref 3.5–5.2)
Alkaline Phosphatase: 90 U/L (ref 39–117)
Anion gap: 9 (ref 5–15)
BUN: 4 mg/dL — ABNORMAL LOW (ref 6–23)
CO2: 26 meq/L (ref 19–32)
CREATININE: 0.73 mg/dL (ref 0.50–1.10)
Calcium: 8.3 mg/dL — ABNORMAL LOW (ref 8.4–10.5)
Chloride: 108 mEq/L (ref 96–112)
GFR calc Af Amer: >90 mL/min (ref >90)
GFR calc non Af Amer: 89 mL/min — ABNORMAL LOW (ref >90)
Glucose, Bld: 90 mg/dL (ref 70–99)
Potassium: 3.7 mEq/L (ref 3.7–5.3)
Sodium: 143 mEq/L (ref 137–147)
Total Bilirubin: 0.2 mg/dL — ABNORMAL LOW (ref 0.3–1.2)
Total Protein: 6 g/dL (ref 6.0–8.3)

## 2014-01-25 LAB — CBC
HCT: 34.4 % — ABNORMAL LOW (ref 36.0–46.0)
Hemoglobin: 11.1 g/dL — ABNORMAL LOW (ref 12.0–15.0)
MCH: 29.4 pg (ref 26.0–34.0)
MCHC: 32.3 g/dL (ref 30.0–36.0)
MCV: 91 fL (ref 78.0–100.0)
Platelets: 425 10*3/uL — ABNORMAL HIGH (ref 150–400)
RBC: 3.78 MIL/uL — ABNORMAL LOW (ref 3.87–5.11)
RDW: 14.8 % (ref 11.5–15.5)
WBC: 7.6 10*3/uL (ref 4.0–10.5)

## 2014-01-25 MED ORDER — VANCOMYCIN 50 MG/ML ORAL SOLUTION: 125.0000 mg | ORAL | Status: DC

## 2014-01-25 MED ORDER — POTASSIUM CHLORIDE CRYS ER 20 MEQ PO TBCR
40.0000 meq | EXTENDED_RELEASE_TABLET | Freq: Once | ORAL | Status: AC
  Administered 2014-01-25: 40 meq via ORAL
  Filled 2014-01-25: qty 2

## 2014-01-25 MED ORDER — VANCOMYCIN 50 MG/ML ORAL SOLUTION: 125.0000 mg | Freq: Two times a day (BID) | ORAL | Status: DC

## 2014-01-25 MED ORDER — VANCOMYCIN 50 MG/ML ORAL SOLUTION: 125.0000 mg | Freq: Every day | ORAL | Status: DC

## 2014-01-25 MED ORDER — VANCOMYCIN 50 MG/ML ORAL SOLUTION
500.0000 mg | Freq: Four times a day (QID) | ORAL | Status: DC
  Administered 2014-01-25 – 2014-01-26 (×4): 500 mg via ORAL
  Filled 2014-01-25 (×6): qty 10

## 2014-01-25 NOTE — Evaluation (Signed)
Physical Therapy One Evaluation Patient Details Name: Whitney Vance MRN: [id number] DOB: 11-03-1950 Today's Date: 01/25/2014   History of Present Illness  Pt is 63 year old female with history of ulcerative colitis, depression, asthma, gastroesophageal reflux disease, depression, who was diagnosed with C. difficile colitis in June of 2015 and admitted 01/24/14 for C. difficile.  Clinical Impression  Patient evaluated by Physical Therapy with no further acute PT needs identified. All education has been completed and the patient has no further questions. Pt ambulating well and no deficits observed.  Pt does report R ankle surgery hx earlier this year and states she was doing outpatient PT however due to life events, did not complete.  Pt agreeable to return to outpatient once medically better.  PT is signing off. Thank you for this referral.     Follow Up Recommendations No PT follow up    Equipment Recommendations  None recommended by PT    Recommendations for Other Services       Precautions / Restrictions Precautions Precautions: None      Mobility  Bed Mobility Overal bed mobility: Modified Independent                Transfers Overall transfer level: Modified independent                  Ambulation/Gait Ambulation/Gait assistance: Supervision;Modified independent (Device/Increase time) Ambulation Distance (Feet): 400 Feet Assistive device: None Gait Pattern/deviations: WFL(Within Functional Limits)     General Gait Details: no unsteadiness or LOB observed  Stairs            Wheelchair Mobility    Modified Rankin (Stroke Patients Only)       Balance                                             Pertinent Vitals/Pain Pain Assessment: No/denies pain    Home Living Family/patient expects to be discharged to:: Private residence Living Arrangements: Spouse/significant other   Type of Home: House       Home Layout: One  level Home Equipment: None      Prior Function Level of Independence: Independent               Hand Dominance        Extremity/Trunk Assessment   Upper Extremity Assessment: Overall WFL for tasks assessed           Lower Extremity Assessment: Overall WFL for tasks assessed;RLE deficits/detail RLE Deficits / Details: slightly decreased AROM on R compared to L however R ankle surgery hx earlier this year    Cervical / Trunk Assessment: Normal  Communication   Communication: No difficulties  Cognition Arousal/Alertness: Awake/alert Behavior During Therapy: WFL for tasks assessed/performed Overall Cognitive Status: Within Functional Limits for tasks assessed                      General Comments      Exercises        Assessment/Plan    PT Assessment Patent does not need any further PT services  PT Diagnosis     PT Problem List    PT Treatment Interventions     PT Goals (Current goals can be found in the Care Plan section) Acute Rehab PT Goals PT Goal Formulation: No goals set, d/c therapy    Frequency  Barriers to discharge        Co-evaluation               End of Session   Activity Tolerance: Patient tolerated treatment well Patient left: in chair;with call bell/phone within reach           Time: 1047-1101 PT Time Calculation (min): 14 min   Charges:   PT Evaluation $Initial PT Evaluation Tier I: 1 Procedure PT Treatments $Gait Training: 8-22 mins   PT G Codes:          Belma Dyches,KATHrine E 01/25/2014, 12:29 PM Zenovia JarredKati Meghanne Pletz, PT, DPT 01/25/2014 Pager: [phone]

## 2014-01-25 NOTE — Progress Notes (Signed)
Agree with beginning shift shift assessment and will continue POC

## 2014-01-25 NOTE — Progress Notes (Signed)
[facility] PROGRESS NOTE   Whitney Vance ZOX:[id number] DOB: July 01, 1950 DOA: 01/24/2014 PCP: [place], BETTY G, MD  HPI/Subjective: Had 5 bloody bowel movements since last night. Mild suprapubic and left lower quadrant discomfort.  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 difficile sample done at the GI office on 01/19/2014.  -Patient with multiple loose stools approximately 15 a day.  -Placed on oral vancomycin, per GI note infectious disease consulted. -C. difficile colitis complicating left sided ulcerative colitis.  Bright red blood per rectum likely UC flareup  -Likely secondary to the ulcerative colitis, C. difficile is less likely to be associated with bright red blood. -Still has significant amount of blood per rectum. -GI following, patient is on Canasa and mesalamine.  Gastroesophageal reflux disease  -PPI discontinued, placed on Pepcid  History of ulcerative colitis  -Will hold patient's budesonide secondary to problem #1. Continue mesalamine. GI consultation pending.   Hypokalemia  -Likely secondary to GI losses. Repleted.   Asthma  -Stable. Albuterol nebs as needed.   Code Status: Full code Family Communication: Plan discussed with the patient. Disposition Plan: Remains inpatient   Consultants:  Gastroenterology  Procedures:  None  Antibiotics:  Oral vancomycin   Objective: Filed Vitals:   01/25/14 0607  BP: 141/75  Pulse: 69  Temp: 97.5 F (36.4 C)  Resp: 18    Intake/Output Summary (Last 24 hours) at 01/25/14 1232 Last data filed at 01/25/14 0500  Gross per 24 hour  Intake 1984.17 ml  Output   1800 ml  Net 184.17 ml   Filed Weights   01/24/14 1658  01/25/14 0607  Weight: 94.802 kg (209 lb) 94.938 kg (209 lb 4.8 oz)    Exam: General: Alert and awake, oriented x3, not in any acute distress. HEENT: anicteric sclera, pupils reactive to light and accommodation, EOMI CVS: S1-S2 clear, no murmur rubs or gallops Chest: clear to auscultation bilaterally, no wheezing, rales or rhonchi Abdomen: soft nontender, nondistended, normal bowel sounds, no organomegaly Extremities: no cyanosis, clubbing or edema noted bilaterally Neuro: Cranial nerves II-XII intact, no focal neurological deficits  Data Reviewed: Basic Metabolic Panel:  Recent Labs Lab 01/24/14 1225 01/25/14 0435  NA 142 143  K 3.5* 3.7  CL 102 108  CO2 26 26  GLUCOSE 92 90  BUN 8 4*  CREATININE 0.62 0.73  CALCIUM 9.2 8.3*  MG 2.0  --    Liver Function Tests:  Recent Labs Lab 01/24/14 1225 01/25/14 0435  AST 12 10  ALT 11 10  ALKPHOS 106 90  BILITOT <0.2* 0.2*  PROT 7.0 6.0  ALBUMIN 3.2* 2.7*   No results found for this basename: LIPASE, AMYLASE,  in the last 168 hours No results found for this basename: AMMONIA,  in the last 168 hours CBC:  Recent Labs Lab 01/24/14 1225 01/25/14 0435  WBC 8.6 7.6  NEUTROABS 4.6  --   HGB 12.5 11.1*  HCT 38.3 34.4*  MCV 89.1 91.0  PLT 498* 425*   Cardiac Enzymes: No results found for this basename: CKTOTAL, CKMB, CKMBINDEX, TROPONINI,  in the last 168 hours BNP (last 3 results) No results found for this basename: PROBNP,  in the last 8760 hours CBG: No  results found for this basename: GLUCAP,  in the last 168 hours  Micro Recent Results (from the past 240 hour(s))  CLOSTRIDIUM DIFFICILE BY PCR     Status: Abnormal   Collection Time    01/19/14  2:44 PM      Result Value Ref Range Status   C difficile by pcr Detected (*) Not Detected Final   Comment: This test is for use only with liquid or soft stools;     performance characteristics of other clinical specimen     types have not been established.            This assay was performed by Cepheid GeneXpert(R) PCR.     The performance characteristics of this assay have     been determined by [facility]. Performance     characteristics refer to the analytical performance     of the test.     Studies: No results found.  Scheduled Meds: . famotidine  20 mg Oral Daily  . mesalamine  1,000 mg Rectal QHS  . mesalamine  2.4 g Oral BID  . PARoxetine  20 mg Oral Daily  . saccharomyces boulardii  250 mg Oral BID  . vancomycin  125 mg Oral QID   Followed by  . [START ON 01/31/2014] vancomycin  125 mg Oral BID   Followed by  . [START ON 02/08/2014] vancomycin  125 mg Oral Daily   Followed by  . [START ON 02/15/2014] vancomycin  125 mg Oral QODAY   Followed by  . [START ON 02/23/2014] vancomycin  125 mg Oral Q3 days   Continuous Infusions: . sodium chloride 0.9 % 1,000 mL infusion 125 mL/hr at 01/25/14 1205       Time spent: 35 minutes    [facility] HospitalELMAHI,Andreya Lacks A  [facility] Pager [phone] If 7PM-7AM, please contact night-coverage at [email or web address], password [email or web address] 01/25/2014, 12:32 PM  LOS: 1 day

## 2014-01-25 NOTE — Consult Note (Signed)
[place] [facility]    Date of Admission:  01/24/2014  Date of Consult:  01/25/2014  Reason for Consult: Recurrent Clostridium difiicile colitis Referring Physician: Dr. Fuller Plan   HPI: Whitney Vance is an 63 y.o. female with recurrent CDI. She apparently was diagnosed with CDI in May (by her account) vs June of 2015. She states that she was started on flagyl $RemoveB'500mg'hrLmMUOC$  tid, but failed to improve, then was switched to oral vancomycin which she took for [redacted] weeks along with [place] Her loose BM had gone from 15 X a day to 6X a day. Her vancomcyin was extended for another 2 weeks with resolution in her diarrhea.   Her UC which had been quiescent has been found on endoscopy to be active once again. showing moderate left sided colitis, mild colitis in the ascending colon, multiple biopsies were performed using cold forceps. Biopsy showed chronic, mild, active colitis. She was started on Uceris 9 mg daily and Lialda 4.9 grams daily  10 days prior to admission, she again began having abdominal pain, cramps and eventually bloody diarrhea. All of this accelerating in last several days. Seen last Friday 8/14 at ED in [place] where she had CT, EKG, labwork. Received IVF and sent home with hydrocodone for headache. CT scan showed: No acute findings to explain etiology of patient's pain. Prominent pericolonic vessels and pericolic lymph nodes involving the distal sigmoid colon, these findings could be chronic. No definite signs of active colitis.  Still not doing well. Called office and vanc was restarted day prior to admission for refractory diarrhea, abdominal pain, chills, sweats, nausea and vomiting.  She is now C Diff PCR + again and restarted on vancomycin orally. She feels her progress is slow. I offered to change to DIFICID but she wished to stay with oral vancomycin.   Past Medical History  Diagnosis Date  . Vitamin B 12 deficiency   . Ulcerative colitis   . Hiatal hernia     . GERD (gastroesophageal reflux disease)   . H/O Clostridium difficile infection 11/2013, 01/2014    has it now 11/16/2013  . Depression   . Asthma     Past Surgical History  Procedure Laterality Date  . Foot surgery  09/28/13  . Vaginal hysterectomy    . Appendectomy    . Cesarean section    ergies:   Allergies  Allergen Reactions  . Demerol [Meperidine] Other (See Comments)    Hallucinations / nervousness / falling spells     Medications: I have reviewed patients current medications as documented in Epic Anti-infectives   Start     Dose/Rate Route Frequency Ordered Stop   03/02/14 1000  vancomycin (VANCOCIN) 50 mg/mL oral solution 125 mg     125 mg Oral Every 3 DAYS 01/25/14 1535 03/17/14 0959   02/23/14 1000  vancomycin (VANCOCIN) 50 mg/mL oral solution 125 mg  Status:  Discontinued     125 mg Oral Every 3 DAYS 01/24/14 1507 01/25/14 1535   02/23/14 1000  vancomycin (VANCOCIN) 50 mg/mL oral solution 125 mg     125 mg Oral Every other day 01/25/14 1535 03/02/14 0959   02/15/14 1000  vancomycin (VANCOCIN) 50 mg/mL oral solution 125 mg  Status:  Discontinued     125 mg Oral Every other day 01/24/14 1507 01/25/14 1535   02/15/14 1000  vancomycin (VANCOCIN) 50 mg/mL oral solution 125 mg     125 mg Oral Daily 01/25/14 1535 02/22/14 0959  02/08/14 1000  vancomycin (VANCOCIN) 50 mg/mL oral solution 125 mg  Status:  Discontinued     125 mg Oral Daily 01/24/14 1507 01/25/14 1535   02/07/14 2200  vancomycin (VANCOCIN) 50 mg/mL oral solution 125 mg     125 mg Oral 2 times daily 01/25/14 1535 02/14/14 2159   01/31/14 2200  vancomycin (VANCOCIN) 50 mg/mL oral solution 125 mg  Status:  Discontinued     125 mg Oral 2 times daily 01/24/14 1507 01/25/14 1535   01/25/14 1800  vancomycin (VANCOCIN) 50 mg/mL oral solution 500 mg     500 mg Oral 4 times daily 01/25/14 1535 02/07/14 1359   01/24/14 1515  vancomycin (VANCOCIN) 50 mg/mL oral solution 125 mg  Status:  Discontinued     125 mg Oral  4 times daily 01/24/14 1507 01/25/14 1535   01/24/14 1515  vancomycin (VANCOCIN) 50 mg/mL oral solution 125 mg     125 mg Oral STAT 01/24/14 1509 01/24/14 1828      Social History:  reports that she quit smoking about 27 years ago. Her smoking use included Cigarettes. She smoked 0.00 packs per day for 20 years. She has never used smokeless tobacco. She reports that she does not drink alcohol or use illicit drugs.  Family History  Problem Relation Age of Onset  . Ulcerative colitis Mother     As in HPI and primary teams notes otherwise 12 point review of systems is negative  Blood pressure 124/62, pulse 71, temperature 98.9 F (37.2 C), temperature source Oral, resp. rate 18, height $RemoveBe'5\' 3"'GBhwOOqmD$  (1.6 m), weight 209 lb 4.8 oz (94.938 kg), SpO2 99.00%. General: Alert and awake, oriented x3, not in any acute distress. HEENT: anicteric sclera, pupils reactive to light and accommodation, EOMI, oropharynx clear and without exudate CVS regular rate, normal r,  no murmur rubs or gallops Chest: clear to auscultation bilaterally, no wheezing, rales or rhonchi Abdomen:distended, tender, +bs Extremities: no  clubbing or edema noted bilaterally Skin: no rashes Neuro: nonfocal, strength and sensation intact   Results for orders placed during the hospital encounter of 01/24/14 (from the past 48 hour(s))  CBC WITH DIFFERENTIAL     Status: Abnormal   Collection Time    01/24/14 12:25 PM      Result Value Ref Range   WBC 8.6  4.0 - 10.5 K/uL   RBC 4.30  3.87 - 5.11 MIL/uL   Hemoglobin 12.5  12.0 - 15.0 g/dL   HCT 38.3  36.0 - 46.0 %   MCV 89.1  78.0 - 100.0 fL   MCH 29.1  26.0 - 34.0 pg   MCHC 32.6  30.0 - 36.0 g/dL   RDW 14.5  11.5 - 15.5 %   Platelets 498 (*) 150 - 400 K/uL   Neutrophils Relative % 53  43 - 77 %   Neutro Abs 4.6  1.7 - 7.7 K/uL   Lymphocytes Relative 32  12 - 46 %   Lymphs Abs 2.8  0.7 - 4.0 K/uL   Monocytes Relative 10  3 - 12 %   Monocytes Absolute 0.9  0.1 - 1.0 K/uL    Eosinophils Relative 4  0 - 5 %   Eosinophils Absolute 0.4  0.0 - 0.7 K/uL   Basophils Relative 1  0 - 1 %   Basophils Absolute 0.1  0.0 - 0.1 K/uL  COMPREHENSIVE METABOLIC PANEL     Status: Abnormal   Collection Time    01/24/14 12:25 PM  Result Value Ref Range   Sodium 142  137 - 147 mEq/L   Potassium 3.5 (*) 3.7 - 5.3 mEq/L   Chloride 102  96 - 112 mEq/L   CO2 26  19 - 32 mEq/L   Glucose, Bld 92  70 - 99 mg/dL   BUN 8  6 - 23 mg/dL   Creatinine, Ser 0.62  0.50 - 1.10 mg/dL   Calcium 9.2  8.4 - 10.5 mg/dL   Total Protein 7.0  6.0 - 8.3 g/dL   Albumin 3.2 (*) 3.5 - 5.2 g/dL   AST 12  0 - 37 U/L   ALT 11  0 - 35 U/L   Alkaline Phosphatase 106  39 - 117 U/L   Total Bilirubin <0.2 (*) 0.3 - 1.2 mg/dL   GFR calc non Af Amer >90  >90 mL/min   GFR calc Af Amer >90  >90 mL/min   Comment: (NOTE)     The eGFR has been calculated using the CKD EPI equation.     This calculation has not been validated in all clinical situations.     eGFR's persistently <90 mL/min signify possible Chronic Kidney     Disease.   Anion gap 14  5 - 15  MAGNESIUM     Status: None   Collection Time    01/24/14 12:25 PM      Result Value Ref Range   Magnesium 2.0  1.5 - 2.5 mg/dL  URINALYSIS, ROUTINE W REFLEX MICROSCOPIC     Status: Abnormal   Collection Time    01/24/14 12:26 PM      Result Value Ref Range   Color, Urine YELLOW  YELLOW   APPearance CLOUDY (*) CLEAR   Specific Gravity, Urine 1.008  1.005 - 1.030   pH 7.0  5.0 - 8.0   Glucose, UA NEGATIVE  NEGATIVE mg/dL   Hgb urine dipstick NEGATIVE  NEGATIVE   Bilirubin Urine NEGATIVE  NEGATIVE   Ketones, ur NEGATIVE  NEGATIVE mg/dL   Protein, ur NEGATIVE  NEGATIVE mg/dL   Urobilinogen, UA 0.2  0.0 - 1.0 mg/dL   Nitrite NEGATIVE  NEGATIVE   Leukocytes, UA SMALL (*) NEGATIVE  URINE MICROSCOPIC-ADD ON     Status: None   Collection Time    01/24/14 12:26 PM      Result Value Ref Range   Squamous Epithelial / LPF RARE  RARE   WBC, UA 7-10  <3  WBC/hpf  COMPREHENSIVE METABOLIC PANEL     Status: Abnormal   Collection Time    01/25/14  4:35 AM      Result Value Ref Range   Sodium 143  137 - 147 mEq/L   Potassium 3.7  3.7 - 5.3 mEq/L   Chloride 108  96 - 112 mEq/L   CO2 26  19 - 32 mEq/L   Glucose, Bld 90  70 - 99 mg/dL   BUN 4 (*) 6 - 23 mg/dL   Creatinine, Ser 0.73  0.50 - 1.10 mg/dL   Calcium 8.3 (*) 8.4 - 10.5 mg/dL   Total Protein 6.0  6.0 - 8.3 g/dL   Albumin 2.7 (*) 3.5 - 5.2 g/dL   AST 10  0 - 37 U/L   ALT 10  0 - 35 U/L   Alkaline Phosphatase 90  39 - 117 U/L   Total Bilirubin 0.2 (*) 0.3 - 1.2 mg/dL   GFR calc non Af Amer 89 (*) >90 mL/min   GFR calc Af Amer >90  >  90 mL/min   Comment: (NOTE)     The eGFR has been calculated using the CKD EPI equation.     This calculation has not been validated in all clinical situations.     eGFR's persistently <90 mL/min signify possible Chronic Kidney     Disease.   Anion gap 9  5 - 15  CBC     Status: Abnormal   Collection Time    01/25/14  4:35 AM      Result Value Ref Range   WBC 7.6  4.0 - 10.5 K/uL   RBC 3.78 (*) 3.87 - 5.11 MIL/uL   Hemoglobin 11.1 (*) 12.0 - 15.0 g/dL   HCT 34.4 (*) 36.0 - 46.0 %   MCV 91.0  78.0 - 100.0 fL   MCH 29.4  26.0 - 34.0 pg   MCHC 32.3  30.0 - 36.0 g/dL   RDW 14.8  11.5 - 15.5 %   Platelets 425 (*) 150 - 400 K/uL   '@BRIEFLABTABLE'$ (sdes,specrequest,cult,reptstatus)   ) Recent Results (from the past 720 hour(s))  CLOSTRIDIUM DIFFICILE BY PCR     Status: Abnormal   Collection Time    01/19/14  2:44 PM      Result Value Ref Range Status   C difficile by pcr Detected (*) Not Detected Final   Comment: This test is for use only with liquid or soft stools;     performance characteristics of other clinical specimen     types have not been established.           This assay was performed by Cepheid GeneXpert(R) PCR.     The performance characteristics of this assay have     been determined by [employer]. Performance      characteristics refer to the analytical performance     of the test.     Impression/Recommendation  Principal Problem:   Recurrent colitis due to Clostridium difficile Active Problems:   B12 DEFICIENCY   ASTHMA   GERD   HIATAL HERNIA   ULCERATIVE COLITIS, LEFT SIDED   BRBPR (bright red blood per rectum)   Hypokalemia   Whitney Vance is a 63 y.o. female with  Recurrent C Difficile. Difficult to ascertain whether this is "only" her FIRST recurrence or whether the failure to respond to flagyl and vancomycin in June represented recurrences.   #1 Recurrent CDI:  I agree with going with PULSE TAPER with vancomycin. I will increase her dose to $Remov'500mg'pTtBcV$  qid for the pulse part of this  Agree with stopping PPI  Agree with holding steroids  Agree with probiotics  She might ultimately benefit from stool transplant for both CDI and UC   #2 Screening: check HIV and hep panel   01/25/2014, 3:36 PM   Thank you so much for this interesting consult  [place] [facility] [place] [phone] (pager) [phone] (office) 01/25/2014, 3:36 PM  Rhina Br[place] 01/25/2014, 3:36 PM

## 2014-01-25 NOTE — Progress Notes (Signed)
     [facility] Progress Note  Subjective:  Feels about the same.  Having diarrhea almost every hour.  Pain only just when needing to have a BM.  Objective:  Vital signs in last 24 hours: Temp:  [97.5 F (36.4 C)-98.8 F (37.1 C)] 97.5 F (36.4 C) (08/19 0607) Pulse Rate:  [69-105] 69 (08/19 0607) Resp:  [18-20] 18 (08/19 0607) BP: (115-146)/(66-75) 141/75 mmHg (08/19 0607) SpO2:  [95 %-99 %] 98 % (08/19 0607) Weight:  [209 lb (94.802 kg)-209 lb 4.8 oz (94.938 kg)] 209 lb 4.8 oz (94.938 kg) (08/19 0607) Last BM Date: 01/24/14 General:  Alert, Well-developed, in NAD Heart:  Regular rate and rhythm; no murmurs Pulm:  CTAB.  No W/R/R. Abdomen:  Soft, non-distended. Normal bowel sounds.  Non-tender.  Extremities:  Without edema. Neurologic:  Alert and  oriented x4;  grossly normal neurologically. Psych:  Alert and cooperative. Normal mood and affect.  Intake/Output from previous day: 08/18 0701 - 08/19 0700 In: 1984.2 [P.O.:480; I.V.:1504.2] Out: 1800 [Urine:1800]  Lab Results:  Recent Labs  01/24/14 1225 01/25/14 0435  WBC 8.6 7.6  HGB 12.5 11.1*  HCT 38.3 34.4*  PLT 498* 425*   BMET  Recent Labs  01/24/14 1225 01/25/14 0435  NA 142 143  K 3.5* 3.7  CL 102 108  CO2 26 26  GLUCOSE 92 90  BUN 8 4*  CREATININE 0.62 0.73  CALCIUM 9.2 8.3*   LFT  Recent Labs  01/25/14 0435  PROT 6.0  ALBUMIN 2.7*  AST 10  ALT 10  ALKPHOS 90  BILITOT 0.2*   Assessment / Plan: *Recurrent C diff in pt with left sided UC:  With severe diarrhea, rectal bleeding, and abdominal pain.  -Taper dose of oral Vancomycin.  ID has been consulted. -Continue Canasa PR, and Lialda PO. -Continue Florastor BID for at least 4 weeks beyond her antibiotic course.  -Agree with holding PPI and replacing with Pepcid.  -Hold budesonide for now. -Continue clear liquids for now until diarrhea is decreasing.    LOS: 1 day   ZEHR, JESSICA D.  01/25/2014, 10:08 AM  Pager  number [phone]     Attending physician's note   I have taken an interval history, reviewed the chart and examined the patient. I agree with the Advanced Practitioner's note, impression and recommendations.   Venita LickMalcolm T. Russella DarStark, MD [facility]

## 2014-01-25 NOTE — Progress Notes (Signed)
OT Cancellation Note  Patient Details Name: Whitney Vance MRN: [id number] DOB: 03-05-1951   Cancelled Treatment:    Reason Eval/Treat Not Completed: PT screened, no needs identified, will sign off  Ahad Colarusso 01/25/2014, 2:05 PM Marica OtterMaryellen Sunya Humbarger, OTR/L [phone] 01/25/2014

## 2014-01-26 ENCOUNTER — Encounter (HOSPITAL_COMMUNITY): Payer: Self-pay | Admitting: *Deleted

## 2014-01-26 DIAGNOSIS — E860 Dehydration: Secondary | ICD-10-CM

## 2014-01-26 DIAGNOSIS — J309 Allergic rhinitis, unspecified: Secondary | ICD-10-CM

## 2014-01-26 LAB — BASIC METABOLIC PANEL
Anion gap: 9 (ref 5–15)
BUN: 3 mg/dL — AB (ref 6–23)
CHLORIDE: 109 meq/L (ref 96–112)
CO2: 27 meq/L (ref 19–32)
Calcium: 8.8 mg/dL (ref 8.4–10.5)
Creatinine, Ser: 0.63 mg/dL (ref 0.50–1.10)
GFR calc Af Amer: >90 mL/min (ref >90)
GFR calc non Af Amer: >90 mL/min (ref >90)
Glucose, Bld: 88 mg/dL (ref 70–99)
POTASSIUM: 4.1 meq/L (ref 3.7–5.3)
Sodium: 145 mEq/L (ref 137–147)

## 2014-01-26 LAB — HEPATITIS PANEL, ACUTE
HCV Ab: NEGATIVE
HEP A IGM: NONREACTIVE
HEP B C IGM: NONREACTIVE
HEP B S AG: NEGATIVE

## 2014-01-26 LAB — CBC
HEMATOCRIT: 35.4 % — AB (ref 36.0–46.0)
HEMOGLOBIN: 11.4 g/dL — AB (ref 12.0–15.0)
MCH: 29.2 pg (ref 26.0–34.0)
MCHC: 32.2 g/dL (ref 30.0–36.0)
MCV: 90.5 fL (ref 78.0–100.0)
Platelets: 429 10*3/uL — ABNORMAL HIGH (ref 150–400)
RBC: 3.91 MIL/uL (ref 3.87–5.11)
RDW: 14.8 % (ref 11.5–15.5)
WBC: 8 10*3/uL (ref 4.0–10.5)

## 2014-01-26 LAB — HIV ANTIBODY (ROUTINE TESTING W REFLEX): HIV 1&2 Ab, 4th Generation: NONREACTIVE

## 2014-01-26 LAB — URINE CULTURE: Colony Count: 10000

## 2014-01-26 MED ORDER — FAMOTIDINE 20 MG PO TABS
20.0000 mg | ORAL_TABLET | Freq: Two times a day (BID) | ORAL | Status: DC
  Administered 2014-01-26 – 2014-01-30 (×8): 20 mg via ORAL
  Filled 2014-01-26 (×9): qty 1

## 2014-01-26 MED ORDER — FIDAXOMICIN 200 MG PO TABS
200.0000 mg | ORAL_TABLET | Freq: Two times a day (BID) | ORAL | Status: DC
  Administered 2014-01-26 – 2014-01-30 (×8): 200 mg via ORAL
  Filled 2014-01-26 (×11): qty 1

## 2014-01-26 NOTE — Progress Notes (Signed)
[facility] PROGRESS NOTE   Whitney Vance ZOX:[id number] DOB: November 15, 1950 DOA: 01/24/2014 PCP: [place], BETTY G, MD  HPI/Subjective: Less abdominal discomfort, about 7-8 bowel movements since yesterday morning. Patient asking to advance her diet.  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 difficile sample done at the GI office on 01/19/2014.  -Patient with multiple loose stools approximately 15 Vance day.  -Placed on oral vancomycin, IV consulted and increased the vancomycin dose to 500 mg. -C. difficile colitis complicating left sided ulcerative colitis.  Bright red blood per rectum likely UC flareup  -Likely secondary to the ulcerative colitis, C. difficile is less likely to be associated with bright red blood. -Still has significant amount of blood per rectum. -GI following, patient is on Canasa and mesalamine.  Gastroesophageal reflux disease  -PPI discontinued, placed on Pepcid  History of ulcerative colitis  -Will hold patient's budesonide secondary to problem #1.   Hypokalemia  -Likely secondary to GI losses. Repleted.   Asthma  -Stable. Albuterol nebs as needed.   Code Status: Full code Family Communication: Plan discussed with the patient. Disposition Plan: Remains inpatient   Consultants:  Gastroenterology  Procedures:  None  Antibiotics:  Oral vancomycin   Objective: Filed Vitals:   01/26/14 0403  BP: 138/70  Pulse: 66  Temp: 98.2 F (36.8 C)  Resp: 18    Intake/Output Summary (Last 24 hours) at 01/26/14 1038 Last data filed at 01/26/14 0700  Gross per 24 hour  Intake 3391.25 ml  Output      1 ml  Net 3390.25 ml   Filed Weights   01/24/14 1658 01/25/14 0607  01/26/14 0403  Weight: 94.802 kg (209 lb) 94.938 kg (209 lb 4.8 oz) 92.625 kg (204 lb 3.2 oz)    Exam: General: Alert and awake, oriented x3, not in any acute distress. HEENT: anicteric sclera, pupils reactive to light and accommodation, EOMI CVS: S1-S2 clear, no murmur rubs or gallops Chest: clear to auscultation bilaterally, no wheezing, rales or rhonchi Abdomen: soft nontender, nondistended, normal bowel sounds, no organomegaly Extremities: no cyanosis, clubbing or edema noted bilaterally Neuro: Cranial nerves II-XII intact, no focal neurological deficits  Data Reviewed: Basic Metabolic Panel:  Recent Labs Lab 01/24/14 1225 01/25/14 0435 01/26/14 0412  NA 142 143 145  K 3.5* 3.7 4.1  CL 102 108 109  CO2 26 26 27   GLUCOSE 92 90 88  BUN 8 4* 3*  CREATININE 0.62 0.73 0.63  CALCIUM 9.2 8.3* 8.8  MG 2.0  --   --    Liver Function Tests:  Recent Labs Lab 01/24/14 1225 01/25/14 0435  AST 12 10  ALT 11 10  ALKPHOS 106 90  BILITOT <0.2* 0.2*  PROT 7.0 6.0  ALBUMIN 3.2* 2.7*   No results found for this basename: LIPASE, AMYLASE,  in the last 168 hours No results found for this basename: AMMONIA,  in the last 168 hours CBC:  Recent Labs Lab 01/24/14 1225 01/25/14 0435 01/26/14 0412  WBC 8.6 7.6 8.0  NEUTROABS 4.6  --   --   HGB 12.5 11.1* 11.4*  HCT 38.3 34.4* 35.4*  MCV 89.1 91.0 90.5  PLT 498* 425* 429*   Cardiac Enzymes: No  results found for this basename: CKTOTAL, CKMB, CKMBINDEX, TROPONINI,  in the last 168 hours BNP (last 3 results) No results found for this basename: PROBNP,  in the last 8760 hours CBG: No results found for this basename: GLUCAP,  in the last 168 hours  Micro Recent Results (from the past 240 hour(s))  CLOSTRIDIUM DIFFICILE BY PCR     Status: Abnormal   Collection Time    01/19/14  2:44 PM      Result Value Ref Range Status   C difficile by pcr Detected (*) Not Detected Final   Comment: This test is for use only with liquid or  soft stools;     performance characteristics of other clinical specimen     types have not been established.           This assay was performed by Cepheid GeneXpert(R) PCR.     The performance characteristics of this assay have     been determined by [facility]. Performance     characteristics refer to the analytical performance     of the test.  URINE CULTURE     Status: None   Collection Time    01/24/14  3:47 PM      Result Value Ref Range Status   Specimen Description URINE, RANDOM   Final   Special Requests NONE   Final   Culture  Setup Time     Final   Value: 01/25/2014 01:48     Performed at [facility]   Colony Count     Final   Value: 10,000 COLONIES/ML     Performed at [facility]   Culture     Final   Value: Multiple bacterial morphotypes present, none predominant. Suggest appropriate recollection if clinically indicated.     Performed at [facility]   Report Status 01/26/2014 FINAL   Final     Studies: No results found.  Scheduled Meds: . famotidine  20 mg Oral Daily  . mesalamine  1,000 mg Rectal QHS  . mesalamine  2.4 Vance Oral BID  . PARoxetine  20 mg Oral Daily  . saccharomyces boulardii  250 mg Oral BID  . vancomycin  500 mg Oral QID   Followed by  . [START ON 02/07/2014] vancomycin  125 mg Oral BID   Followed by  . [START ON 02/15/2014] vancomycin  125 mg Oral Daily   Followed by  . [START ON 02/23/2014] vancomycin  125 mg Oral QODAY   Followed by  . [START ON 03/02/2014] vancomycin  125 mg Oral Q3 days   Continuous Infusions: . sodium chloride 0.9 % 1,000 mL infusion 75 mL/hr at 01/25/14 2235       Time spent: 35 minutes    [facility] HospitalELMAHI,Zaiya Annunziato Vance  [facility] Pager [phone] If 7PM-7AM, please contact night-coverage at [email or web address], password [email or web address] 01/26/2014, 10:38 AM  LOS: 2 days

## 2014-01-26 NOTE — Progress Notes (Signed)
[facility]    Subjective: Felt better this am but then after eating solids vomited and had loose bowel movement, 7 loose bm today vs 9 yesterday   Antibiotics:  Anti-infectives   Start     Dose/Rate Route Frequency Ordered Stop   03/02/14 1000  vancomycin (VANCOCIN) 50 mg/mL oral solution 125 mg  Status:  Discontinued     125 mg Oral Every 3 DAYS 01/25/14 1535 01/26/14 1556   02/23/14 1000  vancomycin (VANCOCIN) 50 mg/mL oral solution 125 mg  Status:  Discontinued     125 mg Oral Every 3 DAYS 01/24/14 1507 01/25/14 1535   02/23/14 1000  vancomycin (VANCOCIN) 50 mg/mL oral solution 125 mg  Status:  Discontinued     125 mg Oral Every other day 01/25/14 1535 01/26/14 1556   02/15/14 1000  vancomycin (VANCOCIN) 50 mg/mL oral solution 125 mg  Status:  Discontinued     125 mg Oral Every other day 01/24/14 1507 01/25/14 1535   02/15/14 1000  vancomycin (VANCOCIN) 50 mg/mL oral solution 125 mg  Status:  Discontinued     125 mg Oral Daily 01/25/14 1535 01/26/14 1556   02/08/14 1000  vancomycin (VANCOCIN) 50 mg/mL oral solution 125 mg  Status:  Discontinued     125 mg Oral Daily 01/24/14 1507 01/25/14 1535   02/07/14 2200  vancomycin (VANCOCIN) 50 mg/mL oral solution 125 mg  Status:  Discontinued     125 mg Oral 2 times daily 01/25/14 1535 01/26/14 1556   01/31/14 2200  vancomycin (VANCOCIN) 50 mg/mL oral solution 125 mg  Status:  Discontinued     125 mg Oral 2 times daily 01/24/14 1507 01/25/14 1535   01/26/14 2200  fidaxomicin (DIFICID) tablet 200 mg     200 mg Oral 2 times daily 01/26/14 1556     01/25/14 1800  vancomycin (VANCOCIN) 50 mg/mL oral solution 500 mg  Status:  Discontinued     500 mg Oral 4 times daily 01/25/14 1535 01/26/14 1556   01/24/14 1515  vancomycin (VANCOCIN) 50 mg/mL oral solution 125 mg  Status:  Discontinued     125 mg Oral 4 times daily 01/24/14 1507 01/25/14 1535   01/24/14 1515  vancomycin (VANCOCIN) 50 mg/mL oral solution 125 mg       125 mg Oral STAT 01/24/14 1509 01/24/14 1828      Medications: Scheduled Meds: . famotidine  20 mg Oral Daily  . fidaxomicin  200 mg Oral BID  . mesalamine  1,000 mg Rectal QHS  . mesalamine  2.4 g Oral BID  . PARoxetine  20 mg Oral Daily  . saccharomyces boulardii  250 mg Oral BID   Continuous Infusions: . sodium chloride 0.9 % 1,000 mL infusion 75 mL/hr at 01/26/14 1714   PRN Meds:.acetaminophen, acetaminophen, albuterol, morphine injection, ondansetron (ZOFRAN) IV, ondansetron, oxyCODONE    Objective: Weight change: -4 lb 12.8 oz (-2.177 kg)  Intake/Output Summary (Last 24 hours) at 01/26/14 1842 Last data filed at 01/26/14 1727  Gross per 24 hour  Intake 2313.75 ml  Output      6 ml  Net 2307.75 ml   Blood pressure 126/62, pulse 76, temperature 97.8 F (36.6 C), temperature source Oral, resp. rate 20, height 5\' 3"  (1.6 m), weight 204 lb 3.2 oz (92.625 kg), SpO2 99.00%. Temp:  [97.8 F (36.6 C)-98.5 F (36.9 C)] 97.8 F (36.6 C) (08/20 1326) Pulse Rate:  [66-76] 76 (08/20 1326) Resp:  [18-20] 20 (  08/20 1326) BP: (122-138)/(62-70) 126/62 mmHg (08/20 1326) SpO2:  [96 %-100 %] 99 % (08/20 1326) Weight:  [204 lb 3.2 oz (92.625 kg)] 204 lb 3.2 oz (92.625 kg) (08/20 0403)  Physical Exam: General: Alert and awake, oriented x3, not in any acute distress. HEENT:  EOMI CVS regular rate, normal r,   Chest:  no wheezing, rales or rhonchi Abdomen: tender diffusely  Skin: no rashes Neuro: nonfocal  CBC:  Recent Labs Lab 01/24/14 1225 01/25/14 0435 01/26/14 0412  HGB 12.5 11.1* 11.4*  HCT 38.3 34.4* 35.4*  PLT 498* 425* 429*     BMET  Recent Labs  01/25/14 0435 01/26/14 0412  NA 143 145  K 3.7 4.1  CL 108 109  CO2 26 27  GLUCOSE 90 88  BUN 4* 3*  CREATININE 0.73 0.63  CALCIUM 8.3* 8.8     Liver Panel   Recent Labs  01/24/14 1225 01/25/14 0435  PROT 7.0 6.0  ALBUMIN 3.2* 2.7*  AST 12 10  ALT 11 10  ALKPHOS 106 90  BILITOT <0.2* 0.2*        Sedimentation Rate No results found for this basename: ESRSEDRATE,  in the last 72 hours C-Reactive Protein No results found for this basename: CRP,  in the last 72 hours  Micro Results: Recent Results (from the past 720 hour(s))  CLOSTRIDIUM DIFFICILE BY PCR     Status: Abnormal   Collection Time    01/19/14  2:44 PM      Result Value Ref Range Status   C difficile by pcr Detected (*) Not Detected Final   Comment: This test is for use only with liquid or soft stools;     performance characteristics of other clinical specimen     types have not been established.           This assay was performed by Cepheid GeneXpert(R) PCR.     The performance characteristics of this assay have     been determined by [facility]. Performance     characteristics refer to the analytical performance     of the test.  URINE CULTURE     Status: None   Collection Time    01/24/14  3:47 PM      Result Value Ref Range Status   Specimen Description URINE, RANDOM   Final   Special Requests NONE   Final   Culture  Setup Time     Final   Value: 01/25/2014 01:48     Performed at [facility] Count     Final   Value: 10,000 COLONIES/ML     Performed at [facility]   Culture     Final   Value: Multiple bacterial morphotypes present, none predominant. Suggest appropriate recollection if clinically indicated.     Performed at [facility]   Report Status 01/26/2014 FINAL   Final    Studies/Results: No results found.    Assessment/Plan:  Principal Problem:   Recurrent colitis due to Clostridium difficile Active Problems:   B12 DEFICIENCY   ASTHMA   GERD   HIATAL HERNIA   ULCERATIVE COLITIS, LEFT SIDED   BRBPR (bright red blood per rectum)   Hypokalemia    Whitney Vance is a 63 y.o. female with  Recurrent CDI and ulcerative colitis  #1 Recurrent CDI:  Patient frustrated with progress. I had offered to go with DIFICID  yesterday  Will make switch today and give  10  days DIFICID bid  Will consider adding vancomycin taper to end of this  Avoid PPI  Avoid un-necessary abx  #2 Screening: HIV and hepatitis negative.     LOS: 2 days   Acey LavCornelius Van Dam 01/26/2014, 6:42 PM

## 2014-01-26 NOTE — Progress Notes (Signed)
     [facility] Progress Note  Subjective:  Feeling better.  Diarrhea less and bleeding less.  Pain/cramping only when needing to have a BM.  Objective:  Vital signs in last 24 hours: Temp:  [98.2 F (36.8 C)-98.9 F (37.2 C)] 98.2 F (36.8 C) (08/20 0403) Pulse Rate:  [66-71] 66 (08/20 0403) Resp:  [18] 18 (08/20 0403) BP: (122-138)/(62-70) 138/70 mmHg (08/20 0403) SpO2:  [96 %-100 %] 100 % (08/20 0403) Weight:  [204 lb 3.2 oz (92.625 kg)] 204 lb 3.2 oz (92.625 kg) (08/20 0403) Last BM Date: 01/26/14 General:  Alert, Well-developed, in NAD Heart:  Regular rate and rhythm; no murmurs Pulm:  CTAB.  No W/R/R. Abdomen:  Soft, non-distended. Normal bowel sounds.  Non-tender. Extremities:  Without edema. Neurologic:  Alert and  oriented x4;  grossly normal neurologically. Psych:  Alert and cooperative. Normal mood and affect.  Intake/Output from previous day: 08/19 0701 - 08/20 0700 In: 4111.3 [P.O.:2660; I.V.:1451.3] Out: 1 [Urine:1]  Lab Results:  Recent Labs  01/24/14 1225 01/25/14 0435 01/26/14 0412  WBC 8.6 7.6 8.0  HGB 12.5 11.1* 11.4*  HCT 38.3 34.4* 35.4*  PLT 498* 425* 429*   BMET  Recent Labs  01/24/14 1225 01/25/14 0435 01/26/14 0412  NA 142 143 145  K 3.5* 3.7 4.1  CL 102 108 109  CO2 26 26 27   GLUCOSE 92 90 88  BUN 8 4* 3*  CREATININE 0.62 0.73 0.63  CALCIUM 9.2 8.3* 8.8   LFT  Recent Labs  01/25/14 0435  PROT 6.0  ALBUMIN 2.7*  AST 10  ALT 10  ALKPHOS 90  BILITOT 0.2*   Assessment / Plan: *Recurrent C diff in pt with left sided UC: With severe diarrhea, rectal bleeding, and abdominal pain.  Improving.  -Pulse/taper dose of oral Vancomycin. ID has been consulted and increased dose.  -Continue Canasa PR, and Lialda PO.  -Continue Florastor BID for at least 4 weeks beyond her antibiotic course.  -Agree with holding PPI and replacing with Pepcid.  -Hold budesonide for now.  -Advanced to lactose free, low fat, low  caffeine full liquid diet today. -Likely can be taken off of tele.    LOS: 2 days   ZEHR, JESSICA D.  01/26/2014, 9:22 AM  Pager number [phone]     Attending physician's note   I have taken an interval history, reviewed the chart and examined the patient. I agree with the Advanced Practitioner's note, impression and recommendations. C diff with bloody diarrhea is improving. Advance to a lactose free, low fat, low caffeine full liquid diet today. Will defer C diff antibiotic regimen and follow up of this problem to ID. Continue current medications for UC, which does not appear to be an active problem at this time.   Venita LickMalcolm T. Russella DarStark, MD [facility]

## 2014-01-27 ENCOUNTER — Inpatient Hospital Stay (HOSPITAL_COMMUNITY): Payer: 59

## 2014-01-27 DIAGNOSIS — J45909 Unspecified asthma, uncomplicated: Secondary | ICD-10-CM

## 2014-01-27 LAB — CBC
HCT: 37.8 % (ref 36.0–46.0)
Hemoglobin: 12 g/dL (ref 12.0–15.0)
MCH: 28.8 pg (ref 26.0–34.0)
MCHC: 31.7 g/dL (ref 30.0–36.0)
MCV: 90.6 fL (ref 78.0–100.0)
PLATELETS: 463 10*3/uL — AB (ref 150–400)
RBC: 4.17 MIL/uL (ref 3.87–5.11)
RDW: 14.6 % (ref 11.5–15.5)
WBC: 8.2 10*3/uL (ref 4.0–10.5)

## 2014-01-27 LAB — BASIC METABOLIC PANEL
ANION GAP: 9 (ref 5–15)
BUN: 3 mg/dL — ABNORMAL LOW (ref 6–23)
CALCIUM: 8.7 mg/dL (ref 8.4–10.5)
CO2: 26 mEq/L (ref 19–32)
CREATININE: 0.66 mg/dL (ref 0.50–1.10)
Chloride: 107 mEq/L (ref 96–112)
Glucose, Bld: 91 mg/dL (ref 70–99)
Potassium: 3.8 mEq/L (ref 3.7–5.3)
SODIUM: 142 meq/L (ref 137–147)

## 2014-01-27 NOTE — Progress Notes (Signed)
     [facility] Progress Note  Subjective: Pt says she doesn't feel as well today as she did yesterday. She has had 7-8 watery BMs today, several of which have been bloody. She also vomited last night, and has waves of nausea today. No fever or chills. Her abdomen is "sore all over" today. She was started on dificid yesterday by ID as she was frustrated with vancomycin.   Objective:  Vital signs in last 24 hours: Temp:  [97.8 F (36.6 C)-98.3 F (36.8 C)] 98.3 F (36.8 C) (08/21 0426) Pulse Rate:  [66-76] 67 (08/21 0426) Resp:  [16-20] 16 (08/21 0426) BP: (106-144)/(56-77) 106/56 mmHg (08/21 0426) SpO2:  [96 %-99 %] 98 % (08/21 0426) Weight:  [204 lb 14.4 oz (92.942 kg)] 204 lb 14.4 oz (92.942 kg) (08/21 0426) Last BM Date: 01/26/14 General:   Alert,  Well-developed,    in NAD Heart:  Regular rate and rhythm; no murmurs Pulm; Abdomen:  Soft, diffusely mildly tender with no rebound or guarding,  Diminished bowel sounds  Extremities:  Without edema. Neurologic:  Alert and  oriented x4;  grossly normal neurologically. Psych:  Alert and cooperative. Normal mood and affect.  Intake/Output from previous day: 08/20 0701 - 08/21 0700 In: 2445 [P.O.:720; I.V.:1725] Out: 6 [Urine:2; Emesis/NG output:1; Stool:3] Intake/Output this shift:    Lab Results:  Recent Labs  01/25/14 0435 01/26/14 0412 01/27/14 0440  WBC 7.6 8.0 8.2  HGB 11.1* 11.4* 12.0  HCT 34.4* 35.4* 37.8  PLT 425* 429* 463*   BMET  Recent Labs  01/25/14 0435 01/26/14 0412 01/27/14 0440  NA 143 145 142  K 3.7 4.1 3.8  CL 108 109 107  CO2 26 27 26   GLUCOSE 90 88 91  BUN 4* 3* 3*  CREATININE 0.73 0.63 0.66  CALCIUM 8.3* 8.8 8.7   LFT  Recent Labs  01/25/14 0435  PROT 6.0  ALBUMIN 2.7*  AST 10  ALT 10  ALKPHOS 90  BILITOT 0.2*   PT/INR No results found for this basename: LABPROT, INR,  in the last 72 hours Hepatitis Panel  Recent Labs  01/26/14 0412  HEPBSAG NEGATIVE    HCVAB NEGATIVE  HEPAIGM NON REACTIVE  HEPBIGM NON REACTIVE    No results found.  Assessment / Plan: *Recurrent C diff in pt with left sided UC: With severe diarrhea, rectal bleeding, and abdominal pain. Was improving, but seems to have a set back with abd pain and nausea. -Continue Canasa PR, and Lialda PO.  -Continue Florastor BID for at least 4 weeks beyond her antibiotic course.  -Agree with holding PPI and replacing with Pepcid.  -Hold budesonide for now.  -Continue lactose free, low fat, low caffeine full liquid diet.  -Continue dificid per ID. -Will obtain abd films to assess for ileus--if pt with ileus, ? Considering addition of IV flagyl.     LOS: 3 days   Tollie PizzaLori P Hvozdovic  PA-C 01/27/2014      Attending physician's note   I have taken an interval history, reviewed the chart and examined the patient. I agree with the Advanced Practitioner's note, impression and recommendations. Symptoms have worsened likely secondary to advanced diet. Check abdominal films today and if negative would change back to a clear liquid diet. Antibiotic mgmt per ID service. Continue current meds for UC.  Venita LickMalcolm T. Russella DarStark, MD [facility]

## 2014-01-27 NOTE — Progress Notes (Signed)
[facility]   Dificid day #2  Subjective: Feels less well today. BM down to 5 yesterday   Antibiotics:  Anti-infectives   Start     Dose/Rate Route Frequency Ordered Stop   03/02/14 1000  vancomycin (VANCOCIN) 50 mg/mL oral solution 125 mg  Status:  Discontinued     125 mg Oral Every 3 DAYS 01/25/14 1535 01/26/14 1556   02/23/14 1000  vancomycin (VANCOCIN) 50 mg/mL oral solution 125 mg  Status:  Discontinued     125 mg Oral Every 3 DAYS 01/24/14 1507 01/25/14 1535   02/23/14 1000  vancomycin (VANCOCIN) 50 mg/mL oral solution 125 mg  Status:  Discontinued     125 mg Oral Every other day 01/25/14 1535 01/26/14 1556   02/15/14 1000  vancomycin (VANCOCIN) 50 mg/mL oral solution 125 mg  Status:  Discontinued     125 mg Oral Every other day 01/24/14 1507 01/25/14 1535   02/15/14 1000  vancomycin (VANCOCIN) 50 mg/mL oral solution 125 mg  Status:  Discontinued     125 mg Oral Daily 01/25/14 1535 01/26/14 1556   02/08/14 1000  vancomycin (VANCOCIN) 50 mg/mL oral solution 125 mg  Status:  Discontinued     125 mg Oral Daily 01/24/14 1507 01/25/14 1535   02/07/14 2200  vancomycin (VANCOCIN) 50 mg/mL oral solution 125 mg  Status:  Discontinued     125 mg Oral 2 times daily 01/25/14 1535 01/26/14 1556   01/31/14 2200  vancomycin (VANCOCIN) 50 mg/mL oral solution 125 mg  Status:  Discontinued     125 mg Oral 2 times daily 01/24/14 1507 01/25/14 1535   01/26/14 2200  fidaxomicin (DIFICID) tablet 200 mg     200 mg Oral 2 times daily 01/26/14 1556     01/25/14 1800  vancomycin (VANCOCIN) 50 mg/mL oral solution 500 mg  Status:  Discontinued     500 mg Oral 4 times daily 01/25/14 1535 01/26/14 1556   01/24/14 1515  vancomycin (VANCOCIN) 50 mg/mL oral solution 125 mg  Status:  Discontinued     125 mg Oral 4 times daily 01/24/14 1507 01/25/14 1535   01/24/14 1515  vancomycin (VANCOCIN) 50 mg/mL oral solution 125 mg     125 mg Oral STAT 01/24/14 1509 01/24/14 1828       Medications: Scheduled Meds: . famotidine  20 mg Oral BID  . fidaxomicin  200 mg Oral BID  . mesalamine  1,000 mg Rectal QHS  . mesalamine  2.4 g Oral BID  . PARoxetine  20 mg Oral Daily  . saccharomyces boulardii  250 mg Oral BID   Continuous Infusions: . sodium chloride 0.9 % 1,000 mL infusion 75 mL/hr at 01/27/14 0551   PRN Meds:.acetaminophen, acetaminophen, albuterol, morphine injection, ondansetron (ZOFRAN) IV, ondansetron, oxyCODONE    Objective: Weight change: 11.2 oz (0.318 kg)  Intake/Output Summary (Last 24 hours) at 01/27/14 1230 Last data filed at 01/27/14 0600  Gross per 24 hour  Intake   1905 ml  Output      2 ml  Net   1903 ml   Blood pressure 106/56, pulse 67, temperature 98.3 F (36.8 C), temperature source Oral, resp. rate 16, height 5\' 3"  (1.6 m), weight 204 lb 14.4 oz (92.942 kg), SpO2 98.00%. Temp:  [97.8 F (36.6 C)-98.3 F (36.8 C)] 98.3 F (36.8 C) (08/21 0426) Pulse Rate:  [66-76] 67 (08/21 0426) Resp:  [16-20] 16 (08/21 0426) BP: (106-144)/(56-77) 106/56 mmHg (08/21 0426) SpO2:  [  96 %-99 %] 98 % 25-Feb-2023 0426) Weight:  [204 lb 14.4 oz (92.942 kg)] 204 lb 14.4 oz (92.942 kg) Feb 25, 2023 0426)  Physical Exam: General: Alert and awake, oriented x3, not in any acute distress. HEENT:  EOMI CVS regular rate, normal r,   Chest:  no wheezing, rales or rhonchi Abdomen: tender diffusely decreased bowel sounds Skin: no rashes Neuro: nonfocal  CBC:  Recent Labs Lab 01/24/14 1225 01/25/14 0435 01/26/14 0412 02/24/2014 0440  HGB 12.5 11.1* 11.4* 12.0  HCT 38.3 34.4* 35.4* 37.8  PLT 498* 425* 429* 463*     BMET  Recent Labs  01/26/14 0412 02-24-2014 0440  NA 145 142  K 4.1 3.8  CL 109 107  CO2 27 26  GLUCOSE 88 91  BUN 3* 3*  CREATININE 0.63 0.66  CALCIUM 8.8 8.7     Liver Panel   Recent Labs  01/25/14 0435  PROT 6.0  ALBUMIN 2.7*  AST 10  ALT 10  ALKPHOS 90  BILITOT 0.2*       Sedimentation Rate No results found  for this basename: ESRSEDRATE,  in the last 72 hours C-Reactive Protein No results found for this basename: CRP,  in the last 72 hours  Micro Results: Recent Results (from the past 720 hour(s))  CLOSTRIDIUM DIFFICILE BY PCR     Status: Abnormal   Collection Time    01/19/14  2:44 PM      Result Value Ref Range Status   C difficile by pcr Detected (*) Not Detected Final   Comment: This test is for use only with liquid or soft stools;     performance characteristics of other clinical specimen     types have not been established.           This assay was performed by Cepheid GeneXpert(R) PCR.     The performance characteristics of this assay have     been determined by [facility]. Performance     characteristics refer to the analytical performance     of the test.  URINE CULTURE     Status: None   Collection Time    01/24/14  3:47 PM      Result Value Ref Range Status   Specimen Description URINE, RANDOM   Final   Special Requests NONE   Final   Culture  Setup Time     Final   Value: 01/25/2014 01:48     Performed at [facility] Count     Final   Value: 10,000 COLONIES/ML     Performed at [facility]   Culture     Final   Value: Multiple bacterial morphotypes present, none predominant. Suggest appropriate recollection if clinically indicated.     Performed at [facility]   Report Status 01/26/2014 FINAL   Final    Studies/Results: Dg Abd 2 Views  February 24, 2014   CLINICAL DATA:  Vomiting and diarrhea.  EXAM: ABDOMEN - 2 VIEW  COMPARISON:  CT 01/26/2007.  FINDINGS: Soft tissue structures are unremarkable. The gas pattern is nonspecific. Calcifications within the pelvis consistent with phleboliths. No bowel distention. No free air. Mild basilar atelectasis.  IMPRESSION: No acute abnormality.   Electronically Signed   By: Maisie Fus  Register   On: 02/24/14 11:55      Assessment/Plan:  Principal Problem:   Recurrent colitis due to  Clostridium difficile Active Problems:   B12 DEFICIENCY   ASTHMA   GERD   HIATAL HERNIA  ULCERATIVE COLITIS, LEFT SIDED   BRBPR (bright red blood per rectum)   Hypokalemia    Whitney Vance is a 63 y.o. female with  Recurrent CDI and ulcerative colitis  #1 Recurrent CDI: we ordered Xray to exclude ileus  Patient frustrated with progress.we started DIFICID yesterday  Continue for 10 days  If there becomes further concern for subtle ileus could add IV flagyl + even rectal vancomycin  I think she needs time to get better  Will consider adding vancomycin taper to end of dificid course  Avoid PPI  Avoid un-necessary abx  Dr. Orvan Falconerampbell is available for questions this weekend.     LOS: 3 days   Whitney Vance 01/27/2014, 12:30 PM

## 2014-01-27 NOTE — Progress Notes (Addendum)
[facility] PROGRESS NOTE   Whitney Vance ZOX:[id number] DOB: [birth year] DOA: 01/24/2014 PCP: [place], BETTY G, MD  HPI/Subjective: Still have significant loose bowel movements, 8-9 bowel movements since yesterday, still bloody. Continues to have mild pelvic discomfort.  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 difficile sample done at the GI office on 01/19/2014.  -Patient with multiple loose stools approximately 15 a day.  -Placed on oral vancomycin, IV consulted and increased the vancomycin dose to 500 mg. -C. difficile colitis complicating left sided ulcerative colitis. -Antibiotics switched to Dificid, continue Florastor and trials to advance diet.  Bright red blood per rectum likely UC flareup  -Likely secondary to the ulcerative colitis, C. difficile is less likely to be associated with bright red blood. -Still has significant amount of blood per rectum. -GI following, patient is on Canasa and mesalamine.  Gastroesophageal reflux disease  -PPI discontinued, placed on Pepcid  History of ulcerative colitis  -Will hold patient's budesonide secondary to problem #1.   Hypokalemia  -Likely secondary to GI losses. Repleted.   Asthma  -Stable. Albuterol nebs as needed.   Code Status: Full code Family Communication: Plan discussed with the patient. Disposition Plan: Remains inpatient   Consultants:  Gastroenterology  Procedures:  None  Antibiotics:  Oral vancomycin   Objective: Filed Vitals:   01/27/14 0426  BP: 106/56  Pulse: 67  Temp: 98.3 F (36.8 C)  Resp: 16    Intake/Output Summary (Last 24 hours) at 01/27/14 1121 Last data filed at 01/27/14 0600  Gross per 24  hour  Intake   1905 ml  Output      3 ml  Net   1902 ml   Filed Weights   01/25/14 0607 01/26/14 0403 01/27/14 0426  Weight: 94.938 kg (209 lb 4.8 oz) 92.625 kg (204 lb 3.2 oz) 92.942 kg (204 lb 14.4 oz)    Exam: General: Alert and awake, oriented x3, not in any acute distress. HEENT: anicteric sclera, pupils reactive to light and accommodation, EOMI CVS: S1-S2 clear, no murmur rubs or gallops Chest: clear to auscultation bilaterally, no wheezing, rales or rhonchi Abdomen: soft nontender, nondistended, normal bowel sounds, no organomegaly Extremities: no cyanosis, clubbing or edema noted bilaterally Neuro: Cranial nerves II-XII intact, no focal neurological deficits  Data Reviewed: Basic Metabolic Panel:  Recent Labs Lab 01/24/14 1225 01/25/14 0435 01/26/14 0412 01/27/14 0440  NA 142 143 145 142  K 3.5* 3.7 4.1 3.8  CL 102 108 109 107  CO2 26 26 27 26   GLUCOSE 92 90 88 91  BUN 8 4* 3* 3*  CREATININE 0.62 0.73 0.63 0.66  CALCIUM 9.2 8.3* 8.8 8.7  MG 2.0  --   --   --    Liver Function Tests:  Recent Labs Lab 01/24/14 1225 01/25/14 0435  AST 12 10  ALT 11 10  ALKPHOS 106 90  BILITOT <0.2* 0.2*  PROT 7.0 6.0  ALBUMIN 3.2* 2.7*   No results found for this basename: LIPASE, AMYLASE,  in the last 168 hours No results found for this basename: AMMONIA,  in the last 168 hours CBC:  Recent Labs Lab 01/24/14 1225 01/25/14 0435 01/26/14 0412 01/27/14 0440  WBC 8.6 7.6  8.0 8.2  NEUTROABS 4.6  --   --   --   HGB 12.5 11.1* 11.4* 12.0  HCT 38.3 34.4* 35.4* 37.8  MCV 89.1 91.0 90.5 90.6  PLT 498* 425* 429* 463*   Cardiac Enzymes: No results found for this basename: CKTOTAL, CKMB, CKMBINDEX, TROPONINI,  in the last 168 hours BNP (last 3 results) No results found for this basename: PROBNP,  in the last 8760 hours CBG: No results found for this basename: GLUCAP,  in the last 168 hours  Micro Recent Results (from the past 240 hour(s))  CLOSTRIDIUM DIFFICILE BY  PCR     Status: Abnormal   Collection Time    01/19/14  2:44 PM      Result Value Ref Range Status   C difficile by pcr Detected (*) Not Detected Final   Comment: This test is for use only with liquid or soft stools;     performance characteristics of other clinical specimen     types have not been established.           This assay was performed by Cepheid GeneXpert(R) PCR.     The performance characteristics of this assay have     been determined by [facility]. Performance     characteristics refer to the analytical performance     of the test.  URINE CULTURE     Status: None   Collection Time    01/24/14  3:47 PM      Result Value Ref Range Status   Specimen Description URINE, RANDOM   Final   Special Requests NONE   Final   Culture  Setup Time     Final   Value: 01/25/2014 01:48     Performed at [facility]   Colony Count     Final   Value: 10,000 COLONIES/ML     Performed at [facility]   Culture     Final   Value: Multiple bacterial morphotypes present, none predominant. Suggest appropriate recollection if clinically indicated.     Performed at [facility]   Report Status 01/26/2014 FINAL   Final     Studies: No results found.  Scheduled Meds: . famotidine  20 mg Oral BID  . fidaxomicin  200 mg Oral BID  . mesalamine  1,000 mg Rectal QHS  . mesalamine  2.4 g Oral BID  . PARoxetine  20 mg Oral Daily  . saccharomyces boulardii  250 mg Oral BID   Continuous Infusions: . sodium chloride 0.9 % 1,000 mL infusion 75 mL/hr at 01/27/14 0551       Time spent: 35 minutes    [facility]EL[facility],Aveion Nguyen A  [facility] Pager [phone] If 7PM-7AM, please contact night-coverage at [email or web address], password [email or web address] 01/27/2014, 11:21 AM  LOS: 3 days

## 2014-01-28 LAB — BASIC METABOLIC PANEL
Anion gap: 9 (ref 5–15)
BUN: 3 mg/dL — AB (ref 6–23)
CO2: 26 mEq/L (ref 19–32)
Calcium: 8.7 mg/dL (ref 8.4–10.5)
Chloride: 108 mEq/L (ref 96–112)
Creatinine, Ser: 0.71 mg/dL (ref 0.50–1.10)
GFR calc Af Amer: >90 mL/min (ref >90)
GFR calc non Af Amer: 90 mL/min — ABNORMAL LOW (ref >90)
Glucose, Bld: 89 mg/dL (ref 70–99)
Potassium: 4 mEq/L (ref 3.7–5.3)
SODIUM: 143 meq/L (ref 137–147)

## 2014-01-28 MED ORDER — HYOSCYAMINE SULFATE 0.125 MG PO TBDP
0.2500 mg | ORAL_TABLET | Freq: Three times a day (TID) | ORAL | Status: DC
  Administered 2014-01-28 – 2014-01-30 (×7): 0.25 mg via ORAL
  Filled 2014-01-28 (×12): qty 2

## 2014-01-28 MED ORDER — HYOSCYAMINE SULFATE 0.125 MG PO TABS
0.2500 mg | ORAL_TABLET | Freq: Three times a day (TID) | ORAL | Status: DC
  Administered 2014-01-28: 0.25 mg via ORAL
  Filled 2014-01-28 (×4): qty 2

## 2014-01-28 MED ORDER — LORATADINE 10 MG PO TABS
10.0000 mg | ORAL_TABLET | Freq: Every day | ORAL | Status: DC | PRN
  Administered 2014-01-28: 10 mg via ORAL
  Filled 2014-01-28: qty 1

## 2014-01-28 NOTE — Progress Notes (Signed)
     [facility] Progress Note  Subjective:  Feels a little better today.  Still had 11 or 12 BM's within the past 24 hours, but some were just small amounts.  Went from dark red blood to no blood with the past few BM's.     Objective:  Vital signs in last 24 hours: Temp:  [97.7 F (36.5 C)-98.3 F (36.8 C)] 98.3 F (36.8 C) (08/22 16100608) Pulse Rate:  [64-71] 71 (08/22 0608) Resp:  [16-18] 18 (08/22 [id number]) BP: (137-138)/(67-73) 137/73 mmHg (08/22 0608) SpO2:  [95 %-99 %] 95 % (08/22 0608) Weight:  [205 lb 11.2 oz (93.305 kg)] 205 lb 11.2 oz (93.305 kg) (08/22 [id number]) Last BM Date: 01/28/14 General:  Alert, Well-developed, in NAD Heart:  Regular rate and rhythm; no murmurs Pulm:  CTAB.  No W/R/R. Abdomen:  Soft, non-distended. Normal bowel sounds.  Non-tender. Extremities:  Without edema. Neurologic:  Alert and  oriented x4;  grossly normal neurologically. Psych:  Alert and cooperative. Normal mood and affect.  Intake/Output from previous day: 08/21 0701 - 08/22 0700 In: 960 [P.O.:120; I.V.:840] Out: 1 [Urine:1]  Lab Results:  Recent Labs  01/26/14 0412 01/27/14 0440  WBC 8.0 8.2  HGB 11.4* 12.0  HCT 35.4* 37.8  PLT 429* 463*   BMET  Recent Labs  01/26/14 0412 01/27/14 0440 01/28/14 0440  NA 145 142 143  K 4.1 3.8 4.0  CL 109 107 108  CO2 27 26 26   GLUCOSE 88 91 89  BUN 3* 3* 3*  CREATININE 0.63 0.66 0.71  CALCIUM 8.8 8.7 8.7   Hepatitis Panel  Recent Labs  01/26/14 0412  HEPBSAG NEGATIVE  HCVAB NEGATIVE  HEPAIGM NON REACTIVE  HEPBIGM NON REACTIVE   Dg Abd 2 Views  01/27/2014   CLINICAL DATA:  Vomiting and diarrhea.  EXAM: ABDOMEN - 2 VIEW  COMPARISON:  CT 01/26/2007.  FINDINGS: Soft tissue structures are unremarkable. The gas pattern is nonspecific. Calcifications within the pelvis consistent with phleboliths. No bowel distention. No free air. Mild basilar atelectasis.  IMPRESSION: No acute abnormality.   Electronically Signed   By:  Maisie Fushomas  Register   On: 01/27/2014 11:55    Assessment / Plan: *Recurrent C diff in pt with left sided UC: With severe diarrhea, rectal bleeding, and abdominal pain.  Feeling a little better today.  -Continue Canasa PR, and Lialda PO.  -Continue Florastor BID for at least 4 weeks beyond her antibiotic course.  -Agree with holding PPI and replacing with Pepcid.  -Hold budesonide for now.  -Will leave her on clear liquid diet for now until seen by Dr. Russella DarStark.  -Continue Dificid per ID; started 8/20.  Will need outpatient follow-up with them. -Will discontinue telemetry.   LOS: 4 days   ZEHR, JESSICA D.  01/28/2014, 8:40 AM  Pager number [phone]     Attending physician's note   I have taken an interval history, reviewed the chart and examined the patient. I agree with the Advanced Practitioner's note, impression and recommendations. C diff colitis is slowly improving on Dificid and Florastor. Continue clear liquids for at least another 24 hours.   Venita LickMalcolm T. Russella DarStark, MD [facility]

## 2014-01-28 NOTE — Progress Notes (Signed)
[facility] PROGRESS NOTE   Whitney Vance ZOX:[id number] DOB: 07-01-1950 DOA: 01/24/2014 PCP: [place], BETTY G, MD  HPI/Subjective: Reported over 11 bowel movements since yesterday morning, all loose. Blood starting to clear up as last bowel movement were more brownish.  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 difficile sample done at the GI office on 01/19/2014.  -Patient with multiple loose stools approximately 15 a day.  -Placed on oral vancomycin, IV consulted and increased the vancomycin dose to 500 mg. -C. difficile colitis complicating left sided ulcerative colitis. -Antibiotics switched to Dificid, continue Florastor, per GI recommendation continue clear liquids.  Bright red blood per rectum likely UC flareup  -Likely secondary to the ulcerative colitis, C. difficile is less likely to be associated with bright red blood. -Still has significant amount of blood per rectum. -GI following, patient is on Canasa and mesalamine.  Gastroesophageal reflux disease  -PPI discontinued, placed on Pepcid  History of ulcerative colitis  -Will hold patient's budesonide secondary to problem #1.   Hypokalemia  -Likely secondary to GI losses. Repleted.   Asthma  -Stable. Albuterol nebs as needed.   Code Status: Full code Family Communication: Plan discussed with the patient. Disposition Plan: Remains inpatient   Consultants:  Gastroenterology  Procedures:  None  Antibiotics:  Oral vancomycin   Objective: Filed Vitals:   01/28/14 0608  BP: 137/73  Pulse: 71  Temp: 98.3 F (36.8 C)  Resp: 18    Intake/Output Summary (Last 24 hours) at 01/28/14 1202 Last data filed at 01/28/14  0612  Gross per 24 hour  Intake    960 ml  Output      1 ml  Net    959 ml   Filed Weights   01/26/14 0403 01/27/14 0426 01/28/14 0608  Weight: 92.625 kg (204 lb 3.2 oz) 92.942 kg (204 lb 14.4 oz) 93.305 kg (205 lb 11.2 oz)    Exam: General: Alert and awake, oriented x3, not in any acute distress. HEENT: anicteric sclera, pupils reactive to light and accommodation, EOMI CVS: S1-S2 clear, no murmur rubs or gallops Chest: clear to auscultation bilaterally, no wheezing, rales or rhonchi Abdomen: soft nontender, nondistended, normal bowel sounds, no organomegaly Extremities: no cyanosis, clubbing or edema noted bilaterally Neuro: Cranial nerves II-XII intact, no focal neurological deficits  Data Reviewed: Basic Metabolic Panel:  Recent Labs Lab 01/24/14 1225 01/25/14 0435 01/26/14 0412 01/27/14 0440 01/28/14 0440  NA 142 143 145 142 143  K 3.5* 3.7 4.1 3.8 4.0  CL 102 108 109 107 108  CO2 26 26 27 26 26   GLUCOSE 92 90 88 91 89  BUN 8 4* 3* 3* 3*  CREATININE 0.62 0.73 0.63 0.66 0.71  CALCIUM 9.2 8.3* 8.8 8.7 8.7  MG 2.0  --   --   --   --    Liver Function Tests:  Recent Labs Lab 01/24/14 1225 01/25/14 0435  AST 12 10  ALT 11 10  ALKPHOS 106 90  BILITOT <0.2* 0.2*  PROT 7.0 6.0  ALBUMIN 3.2* 2.7*   No results found for this basename: LIPASE, AMYLASE,  in the last 168 hours No results found for this basename: AMMONIA,  in the last  168 hours CBC:  Recent Labs Lab 01/24/14 1225 01/25/14 0435 01/26/14 0412 01/27/14 0440  WBC 8.6 7.6 8.0 8.2  NEUTROABS 4.6  --   --   --   HGB 12.5 11.1* 11.4* 12.0  HCT 38.3 34.4* 35.4* 37.8  MCV 89.1 91.0 90.5 90.6  PLT 498* 425* 429* 463*   Cardiac Enzymes: No results found for this basename: CKTOTAL, CKMB, CKMBINDEX, TROPONINI,  in the last 168 hours BNP (last 3 results) No results found for this basename: PROBNP,  in the last 8760 hours CBG: No results found for this basename: GLUCAP,  in the last 168  hours  Micro Recent Results (from the past 240 hour(s))  CLOSTRIDIUM DIFFICILE BY PCR     Status: Abnormal   Collection Time    01/19/14  2:44 PM      Result Value Ref Range Status   C difficile by pcr Detected (*) Not Detected Final   Comment: This test is for use only with liquid or soft stools;     performance characteristics of other clinical specimen     types have not been established.           This assay was performed by Cepheid GeneXpert(R) PCR.     The performance characteristics of this assay have     been determined by [facility]. Performance     characteristics refer to the analytical performance     of the test.  URINE CULTURE     Status: None   Collection Time    01/24/14  3:47 PM      Result Value Ref Range Status   Specimen Description URINE, RANDOM   Final   Special Requests NONE   Final   Culture  Setup Time     Final   Value: 01/25/2014 01:48     Performed at [facility]   Colony Count     Final   Value: 10,000 COLONIES/ML     Performed at [facility]   Culture     Final   Value: Multiple bacterial morphotypes present, none predominant. Suggest appropriate recollection if clinically indicated.     Performed at [facility]   Report Status 01/26/2014 FINAL   Final     Studies: Dg Abd 2 Views  01/27/2014   CLINICAL DATA:  Vomiting and diarrhea.  EXAM: ABDOMEN - 2 VIEW  COMPARISON:  CT 01/26/2007.  FINDINGS: Soft tissue structures are unremarkable. The gas pattern is nonspecific. Calcifications within the pelvis consistent with phleboliths. No bowel distention. No free air. Mild basilar atelectasis.  IMPRESSION: No acute abnormality.   Electronically Signed   By: Maisie Fushomas  Register   On: 01/27/2014 11:55    Scheduled Meds: . famotidine  20 mg Oral BID  . fidaxomicin  200 mg Oral BID  . mesalamine  1,000 mg Rectal QHS  . mesalamine  2.4 g Oral BID  . PARoxetine  20 mg Oral Daily  . saccharomyces boulardii  250 mg Oral  BID   Continuous Infusions: . sodium chloride 0.9 % 1,000 mL infusion 75 mL/hr at 01/28/14 [id number]       Time spent: 35 minutes    [facility] LLCELMAHI,Kaynen Minner A  [facility] Pager [phone] If 7PM-7AM, please contact night-coverage at [email or web address], password [email or web address] 01/28/2014, 12:02 PM  LOS: 4 days

## 2014-01-29 DIAGNOSIS — E538 Deficiency of other specified B group vitamins: Secondary | ICD-10-CM

## 2014-01-29 MED ORDER — MENTHOL 3 MG MT LOZG
1.0000 | LOZENGE | OROMUCOSAL | Status: DC | PRN
  Administered 2014-01-29: 3 mg via ORAL
  Filled 2014-01-29: qty 9

## 2014-01-29 NOTE — Progress Notes (Signed)
[facility] PROGRESS NOTE   Whitney Vance ZOX:[id number] DOB: 03-Nov-1950 DOA: 01/24/2014 PCP: [place], BETTY G, MD  HPI/Subjective: Reported a bowel movement since yesterday morning, no bowel movements since 7 PM last night. Patient on clear liquids, diet per GI.  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 difficile sample done at the GI office on 01/19/2014.  -Patient with multiple loose stools approximately 15 a day.  -Placed on oral vancomycin, IV consulted and increased the vancomycin dose to 500 mg. -C. difficile colitis complicating left sided ulcerative colitis. -Antibiotics switched to Dificid, continue Florastor, per GI recommendation continue clear liquids.  Bright red blood per rectum likely UC flareup  -Likely secondary to the ulcerative colitis, C. difficile is less likely to be associated with bright red blood. -Still has significant amount of blood per rectum. -GI following, patient is on Canasa and mesalamine.  Gastroesophageal reflux disease  -PPI discontinued, placed on Pepcid  History of ulcerative colitis  -Will hold patient's budesonide secondary to problem #1.   Hypokalemia  -Likely secondary to GI losses. Repleted.   Asthma  -Stable. Albuterol nebs as needed.   Code Status: Full code Family Communication: Plan discussed with the patient. Disposition Plan: Remains inpatient   Consultants:  Gastroenterology  Procedures:  None  Antibiotics:  Oral vancomycin   Objective: Filed Vitals:   01/29/14 0514  BP: 112/54  Pulse: 63  Temp: 97.8 F (36.6 C)  Resp: 18    Intake/Output Summary (Last 24 hours) at 01/29/14 1049 Last data filed at 01/29/14 0655  Gross per 24 hour  Intake 2573.75 ml  Output      0 ml  Net 2573.75 ml   Filed Weights   01/27/14 0426 01/28/14 0608 01/29/14 0514  Weight: 92.942 kg (204 lb 14.4 oz) 93.305 kg (205 lb 11.2 oz) 92.126 kg (203 lb 1.6 oz)    Exam: General: Alert and awake, oriented x3, not in any acute distress. HEENT: anicteric sclera, pupils reactive to light and accommodation, EOMI CVS: S1-S2 clear, no murmur rubs or gallops Chest: clear to auscultation bilaterally, no wheezing, rales or rhonchi Abdomen: soft nontender, nondistended, normal bowel sounds, no organomegaly Extremities: no cyanosis, clubbing or edema noted bilaterally Neuro: Cranial nerves II-XII intact, no focal neurological deficits  Data Reviewed: Basic Metabolic Panel:  Recent Labs Lab 01/24/14 1225 01/25/14 0435 01/26/14 0412 01/27/14 0440 01/28/14 0440  NA 142 143 145 142 143  K 3.5* 3.7 4.1 3.8 4.0  CL 102 108 109 107 108  CO2 GLUCOSE 92 90 88 91 89  BUN 8 4* 3* 3* 3*  CREATININE 0.62 0.73 0.63 0.66 0.71  CALCIUM 9.2 8.3* 8.8 8.7 8.7  MG 2.0  --   --   --   --    Liver Function Tests:  Recent Labs Lab 01/24/14 1225 01/25/14 0435  AST 12 10  ALT 11 10  ALKPHOS 106 90  BILITOT <0.2* 0.2*  PROT 7.0 6.0  ALBUMIN 3.2* 2.7*   No results found for this basename: LIPASE, AMYLASE,  in the last 168 hours No results found for this basename: AMMONIA,  in the last 168 hours CBC:  Recent Labs Lab  01/24/14 1225 01/25/14 0435 01/26/14 0412 01/27/14 0440  WBC 8.6 7.6 8.0 8.2  NEUTROABS 4.6  --   --   --   HGB 12.5 11.1* 11.4* 12.0  HCT 38.3 34.4* 35.4* 37.8  MCV 89.1 91.0 90.5 90.6  PLT 498* 425* 429* 463*   Cardiac Enzymes: No results found for this basename: CKTOTAL, CKMB, CKMBINDEX, TROPONINI,  in the last 168 hours BNP (last 3 results) No results found for this basename: PROBNP,  in the last 8760 hours CBG: No results found for this basename: GLUCAP,  in the last 168  hours  Micro Recent Results (from the past 240 hour(s))  CLOSTRIDIUM DIFFICILE BY PCR     Status: Abnormal   Collection Time    01/19/14  2:44 PM      Result Value Ref Range Status   C difficile by pcr Detected (*) Not Detected Final   Comment: This test is for use only with liquid or soft stools;     performance characteristics of other clinical specimen     types have not been established.           This assay was performed by Cepheid GeneXpert(R) PCR.     The performance characteristics of this assay have     been determined by [facility]. Performance     characteristics refer to the analytical performance     of the test.  URINE CULTURE     Status: None   Collection Time    01/24/14  3:47 PM      Result Value Ref Range Status   Specimen Description URINE, RANDOM   Final   Special Requests NONE   Final   Culture  Setup Time     Final   Value: 01/25/2014 01:48     Performed at [facility] Count     Final   Value: 10,000 COLONIES/ML     Performed at [facility]   Culture     Final   Value: Multiple bacterial morphotypes present, none predominant. Suggest appropriate recollection if clinically indicated.     Performed at [facility]   Report Status 01/26/2014 FINAL   Final     Studies: Dg Abd 2 Views  01/27/2014   CLINICAL DATA:  Vomiting and diarrhea.  EXAM: ABDOMEN - 2 VIEW  COMPARISON:  CT 01/26/2007.  FINDINGS: Soft tissue structures are unremarkable. The gas pattern is nonspecific. Calcifications within the pelvis consistent with phleboliths. No bowel distention. No free air. Mild basilar atelectasis.  IMPRESSION: No acute abnormality.   Electronically Signed   By: Maisie Fus  Register   On: 01/27/2014 11:55    Scheduled Meds: . famotidine  20 mg Oral BID  . fidaxomicin  200 mg Oral BID  . hyoscyamine  0.25 mg Oral TID AC & HS  . mesalamine  1,000 mg Rectal QHS  . mesalamine  2.4 g Oral BID  . PARoxetine  20 mg Oral Daily   . saccharomyces boulardii  250 mg Oral BID   Continuous Infusions: . sodium chloride 0.9 % 1,000 mL infusion 75 mL/hr at 01/29/14 0132       Time spent: 35 minutes    [facility] A  [facility] Pager [phone] If 7PM-7AM, please contact night-coverage at [email or web address], password [facility] 01/29/2014, 10:49 AM  LOS: 5 days

## 2014-01-29 NOTE — Progress Notes (Signed)
     [facility] Progress Note  Subjective:  Had 7 or 8 BM's within the past 24 hours, but none after 7 PM last evening.  Some still with red blood.  Objective:  Vital signs in last 24 hours: Temp:  [97.8 F (36.6 C)-98.5 F (36.9 C)] 97.8 F (36.6 C) (08/23 0514) Pulse Rate:  [63-77] 63 (08/23 0514) Resp:  [18] 18 (08/23 0514) BP: (112-136)/(54-73) 112/54 mmHg (08/23 0514) SpO2:  [95 %-97 %] 95 % (08/23 0514) Weight:  [203 lb 1.6 oz (92.126 kg)] 203 lb 1.6 oz (92.126 kg) (08/23 0514) Last BM Date: 01/28/14 General:  Alert, Well-developed, in NAD Heart:  Regular rate and rhythm; no murmurs Pulm:  CTAB.  No W/R/R. Abdomen:  Soft, non-distended. Normal bowel sounds.  Non-tender. Extremities:  Without edema. Neurologic:  Alert and  oriented x4;  grossly normal neurologically. Psych:  Alert and cooperative. Normal mood and affect.  Intake/Output from previous day: 08/22 0701 - 08/23 0700 In: 2813.8 [P.O.:960; I.V.:1853.8] Out: -   Lab Results:  Recent Labs  01/27/14 0440  WBC 8.2  HGB 12.0  HCT 37.8  PLT 463*   BMET  Recent Labs  01/27/14 0440 01/28/14 0440  NA 142 143  K 3.8 4.0  CL 107 108  CO2 26 26  GLUCOSE 91 89  BUN 3* 3*  CREATININE 0.66 0.71  CALCIUM 8.7 8.7   Dg Abd 2 Views  01/27/2014   CLINICAL DATA:  Vomiting and diarrhea.  EXAM: ABDOMEN - 2 VIEW  COMPARISON:  CT 01/26/2007.  FINDINGS: Soft tissue structures are unremarkable. The gas pattern is nonspecific. Calcifications within the pelvis consistent with phleboliths. No bowel distention. No free air. Mild basilar atelectasis.  IMPRESSION: No acute abnormality.   Electronically Signed   By: Maisie Fus  Register   On: 01/27/2014 11:55    Assessment / Plan: *Recurrent C diff in pt with left sided UC: With severe diarrhea, rectal bleeding, and abdominal pain. Feeling a little better today.   -Continue Canasa PR, and Lialda PO.  -Continue Florastor BID for at least 4 weeks beyond her  antibiotic course.  -Agree with holding PPI and replacing with Pepcid.  -Hold budesonide for now.  -Will leave her on clear liquid diet for now until seen by Dr. Russella Dar.  -Continue Dificid per ID; started 8/20. Will need outpatient follow-up with them.    LOS: 5 days   ZEHR, JESSICA D.  01/29/2014, 8:40 AM  Pager number [phone]     Attending physician's note   I have taken an interval history, reviewed the chart and examined the patient. I agree with the Advanced Practitioner's note, impression and recommendations. Improved over the last 24 hours. She wants to advance her diet. Restricted full liquid diet ordered. Continue Dificid, Florastor, Levsin, Lialda and Canasa. ID follow up as outpatient for C diff. GI follow up for UC with Dr. Rhea Belton.   Venita Lick. Russella Dar, MD [facility]

## 2014-01-30 LAB — BASIC METABOLIC PANEL
Anion gap: 10 (ref 5–15)
BUN: 3 mg/dL — AB (ref 6–23)
CO2: 26 mEq/L (ref 19–32)
Calcium: 8.7 mg/dL (ref 8.4–10.5)
Chloride: 107 mEq/L (ref 96–112)
Creatinine, Ser: 0.71 mg/dL (ref 0.50–1.10)
GFR calc Af Amer: >90 mL/min (ref >90)
GFR calc non Af Amer: 90 mL/min — ABNORMAL LOW (ref >90)
Glucose, Bld: 87 mg/dL (ref 70–99)
Potassium: 4 mEq/L (ref 3.7–5.3)
Sodium: 143 mEq/L (ref 137–147)

## 2014-01-30 LAB — CBC
HCT: 36.3 % (ref 36.0–46.0)
Hemoglobin: 11.6 g/dL — ABNORMAL LOW (ref 12.0–15.0)
MCH: 28.9 pg (ref 26.0–34.0)
MCHC: 32 g/dL (ref 30.0–36.0)
MCV: 90.3 fL (ref 78.0–100.0)
PLATELETS: 438 10*3/uL — AB (ref 150–400)
RBC: 4.02 MIL/uL (ref 3.87–5.11)
RDW: 14.7 % (ref 11.5–15.5)
WBC: 7.9 10*3/uL (ref 4.0–10.5)

## 2014-01-30 MED ORDER — FIDAXOMICIN 200 MG PO TABS: 200.0000 mg | ORAL_TABLET | Freq: Two times a day (BID) | ORAL | Status: DC

## 2014-01-30 MED ORDER — SACCHAROMYCES BOULARDII 250 MG PO CAPS: 250.0000 mg | ORAL_CAPSULE | Freq: Two times a day (BID) | ORAL | Status: DC

## 2014-01-30 MED ORDER — VANCOMYCIN HCL 125 MG PO CAPS: 125.0000 mg | ORAL_CAPSULE | Freq: Four times a day (QID) | ORAL | Status: DC

## 2014-01-30 MED ORDER — FAMOTIDINE 20 MG PO TABS: 20.0000 mg | ORAL_TABLET | Freq: Two times a day (BID) | ORAL | Status: DC

## 2014-01-30 NOTE — Progress Notes (Signed)
[facility] Progress Note    Since last GI note: Two loose stools yesterday, none overnight.  2 loose stools this AM.  No bleeding in 28 hours.  Feeling much better overall.  Objective: Vital signs in last 24 hours: Temp:  [98.1 F (36.7 C)-98.4 F (36.9 C)] 98.1 F (36.7 C) (08/24 0659) Pulse Rate:  [68-88] 68 (08/24 0659) Resp:  [16-18] 16 (08/24 0659) BP: (122-141)/(74-78) 141/78 mmHg (08/24 0659) SpO2:  [97 %-98 %] 98 % (08/24 0659) Weight:  [203 lb 6.4 oz (92.262 kg)] 203 lb 6.4 oz (92.262 kg) (08/24 0659) Last BM Date: 01/30/14 General: alert and oriented times 3 Heart: regular rate and rythm Abdomen: soft, non-tender, non-distended, normal bowel sounds   Lab Results:  Recent Labs  01/30/14 0440  WBC 7.9  HGB 11.6*  PLT 438*  MCV 90.3    Recent Labs  01/28/14 0440 01/30/14 0440  NA 143 143  K 4.0 4.0  CL 108 107  CO2 26 26  GLUCOSE 89 87  BUN 3* 3*  CREATININE 0.71 0.71  CALCIUM 8.7 8.7    Medications: Scheduled Meds: . famotidine  20 mg Oral BID  . fidaxomicin  200 mg Oral BID  . hyoscyamine  0.25 mg Oral TID AC & HS  . mesalamine  1,000 mg Rectal QHS  . mesalamine  2.4 g Oral BID  . PARoxetine  20 mg Oral Daily  . saccharomyces boulardii  250 mg Oral BID   Continuous Infusions: . sodium chloride 0.9 % 1,000 mL infusion 75 mL/hr at 01/30/14 0521   PRN Meds:.acetaminophen, acetaminophen, albuterol, loratadine, menthol-cetylpyridinium, morphine injection, ondansetron (ZOFRAN) IV, ondansetron, oxyCODONE    Assessment/Plan: 63 y.o. female with c. Difficile colitis on existing UC  Much improved since regimen changed to fidaxomicin, florastor.  ID input appreciated.  She is probably OK to d/c today. Already has GI follow up with Dr. Rhea Belton set for about 3 weeks from now. Will need follow up with ID, instruction on how long to take abx, florastor.  She should continue her UC meds (oral and rectal mesalamine) without change.  I will  advance her diet to regular this AM.  Please call or page with any further questions or concerns.   Rachael Fee, MD  01/30/2014, 8:45 AM [facility] Pager [phone]

## 2014-01-30 NOTE — Discharge Summary (Addendum)
Physician Discharge Summary  Whitney Vance:[id number] DOB: 1950/10/27 DOA: 01/24/2014  PCP: [place], BETTY G, MD  Admit date: 01/24/2014 Discharge date: 01/30/2014  Time spent: 40 minutes  Recommendations for Outpatient Follow-up:  1. Followup with primary care physician in one week. 2. Followup with Dr. Rhea Belton in 3 weeks. 3. Discharged of Dificid, followed by prolonged oral vancocin taper.  Discharge Diagnoses:  Principal Problem:   Recurrent colitis due to Clostridium difficile Active Problems:   B12 DEFICIENCY   ASTHMA   GERD   HIATAL HERNIA   ULCERATIVE COLITIS, LEFT SIDED   BRBPR (bright red blood per rectum)   Hypokalemia   Discharge Condition: Stable  Diet recommendation: Healthy  Filed Weights   01/28/14 0608 01/29/14 0514 01/30/14 0659  Weight: 93.305 kg (205 lb 11.2 oz) 92.126 kg (203 lb 1.6 oz) 92.262 kg (203 lb 6.4 oz)    History of present illness:  Whitney Vance is a 63 y.o. female  With history of ulcerative colitis, depression, asthma, gastroesophageal reflux disease, depression, who was diagnosed with C. difficile colitis in June of 2015 and was treated with a course of Flagyl and oral vancomycin for a total of 21 days who presents to the ED with a one and a half week history of persistent loose stools approximately 15 watery stools per day with some associated nausea and nonbloody emesis as well as worsening bright red blood per rectum over the past week. Patient also endorses lower abdominal pain which has been constant in nature and becomes a shooting pain across her abdomen whenever patient is having a bowel movement. Patient also endorses generalized weakness fatigue chills and subjective fevers. Patient denies any constipation, no dysuria, no chest pain, no shortness of breath, no cough. Patient stated that she didn't contact the gastroenterologist office about his symptoms a stool sample was taken 5 days prior to admission and she was called one day  prior to admission that this stool was also positive for C. difficile colitis. Oral vancomycin was called in to the patient's pharmacy and patient took 2 tablets yesterday and one this morning. Patient stated that his symptoms are not improving to the point where she was having a bowel movement every 30 minutes and subsequently called the gastroenterologist office. Patient was subsequently redirected to present to the emergency room for further evaluation and management.  Patient was seen in the emergency room, comprehensive metabolic profile done a potassium of 3.5 albumin of 3.2 total bilirubin less than 0.4 vessels within normal limits. CBC had a platelet count of 498 otherwise was within normal limits. Urinalysis was cloudy nitrite negative small leukocytes 7-10 WBCs. We were called to admit the patient for further evaluation and management.   Hospital Course:   Recurrent colitis due to C. difficile  -C. difficile colitis complicating left sided ulcerative colitis.  -Patient with recent positive C. Difficile, sample done at the GI office on 01/19/2014.  -Patient with multiple loose stools approximately about 15 a day.  -Placed on oral vancomycin, ID consulted and increased the vancomycin dose to 500 mg.  -Antibiotics switched to Dificid, continue Florastor. Discontinue PPI. -On discharge Dificid for 7 more days, followed by vancocin taper, continue Florastor for at least one month.  Bright red blood per rectum likely UC flareup  -Likely secondary to the ulcerative colitis, C. difficile is less likely to be associated with bright red blood.  -Still has significant amount of blood per rectum.  -GI following, home medications continued.  Gastroesophageal reflux  disease  -PPI discontinued, placed on Pepcid.   History of ulcerative colitis  -Budesonide initially was held, but all oral steroids for ulcerative colitis restarted prior to discharge.  Hypokalemia  -Likely secondary to GI losses.  Repleted.   Asthma  -Stable. Albuterol nebs as needed.      Procedures:  None  Consultations:  None  Discharge Exam: Filed Vitals:   01/30/14 0659  BP: 141/78  Pulse: 68  Temp: 98.1 F (36.7 C)  Resp: 16   General: Alert and awake, oriented x3, not in any acute distress. HEENT: anicteric sclera, pupils reactive to light and accommodation, EOMI CVS: S1-S2 clear, no murmur rubs or gallops Chest: clear to auscultation bilaterally, no wheezing, rales or rhonchi Abdomen: soft nontender, nondistended, normal bowel sounds, no organomegaly Extremities: no cyanosis, clubbing or edema noted bilaterally Neuro: Cranial nerves II-XII intact, no focal neurological deficits  Discharge Instructions You were cared for by a hospitalist during your hospital stay. If you have any questions about your discharge medications or the care you received while you were in the hospital after you are discharged, you can call the unit and asked to speak with the hospitalist on call if the hospitalist that took care of you is not available. Once you are discharged, your primary care physician will handle any further medical issues. Please note that NO REFILLS for any discharge medications will be authorized once you are discharged, as it is imperative that you return to your primary care physician (or establish a relationship with a primary care physician if you do not have one) for your aftercare needs so that they can reassess your need for medications and monitor your lab values.      Discharge Instructions   Diet - low sodium heart healthy    Complete by:  As directed      Increase activity slowly    Complete by:  As directed             Medication List    STOP taking these medications       omeprazole 20 MG capsule  Commonly known as:  PRILOSEC      TAKE these medications       aspirin EC 81 MG tablet  Take 81 mg by mouth daily.     famotidine 20 MG tablet  Commonly known as:   PEPCID  Take 1 tablet (20 mg total) by mouth 2 (two) times daily.     fidaxomicin 200 MG Tabs tablet  Commonly known as:  DIFICID  Take 1 tablet (200 mg total) by mouth 2 (two) times daily.     GLUCOSAMINE 1500 COMPLEX Caps  Take by mouth 2 (two) times daily.     mesalamine 1.2 G EC tablet  Commonly known as:  LIALDA  Take 1.2 g by mouth daily with breakfast.     mesalamine 1000 MG suppository  Commonly known as:  CANASA  Place 1,000 mg rectally at bedtime.     PARoxetine 20 MG tablet  Commonly known as:  PAXIL  Take 20 mg by mouth daily.     saccharomyces boulardii 250 MG capsule  Commonly known as:  FLORASTOR  Take 1 capsule (250 mg total) by mouth 2 (two) times daily.     UCERIS 9 MG Tb24  Generic drug:  Budesonide  Take 9 mg by mouth daily.     vancomycin 125 MG capsule  Commonly known as:  VANCOCIN HCL  Take 1 capsule (125 mg total) by  mouth 4 (four) times daily.       Allergies  Allergen Reactions  . Demerol [Meperidine] Other (See Comments)    Hallucinations / nervousness / falling spells   Follow-up Information   Follow up with [place], Timoteo Expose, MD In 1 week.   Specialty:  Family Medicine   Contact information:   [address] [place] [address] [phone]       Follow up with Beverley Fiedler, MD In 3 weeks.   Specialty:  Gastroenterology   Contact information:   [address] [place] [address] [phone]        The results of significant diagnostics from this hospitalization (including imaging, microbiology, ancillary and laboratory) are listed below for reference.    Significant Diagnostic Studies: Dg Abd 2 Views  01/27/2014   CLINICAL DATA:  Vomiting and diarrhea.  EXAM: ABDOMEN - 2 VIEW  COMPARISON:  CT 01/26/2007.  FINDINGS: Soft tissue structures are unremarkable. The gas pattern is nonspecific. Calcifications within the pelvis consistent with phleboliths. No bowel distention. No free air. Mild basilar atelectasis.   IMPRESSION: No acute abnormality.   Electronically Signed   By: Maisie Fus  Register   On: 01/27/2014 11:55    Microbiology: Recent Results (from the past 240 hour(s))  URINE CULTURE     Status: None   Collection Time    01/24/14  3:47 PM      Result Value Ref Range Status   Specimen Description URINE, RANDOM   Final   Special Requests NONE   Final   Culture  Setup Time     Final   Value: 01/25/2014 01:48     Performed at [facility] Count     Final   Value: 10,000 COLONIES/ML     Performed at [facility]   Culture     Final   Value: Multiple bacterial morphotypes present, none predominant. Suggest appropriate recollection if clinically indicated.     Performed at [facility]   Report Status 01/26/2014 FINAL   Final     Labs: Basic Metabolic Panel:  Recent Labs Lab 01/24/14 1225 01/25/14 0435 01/26/14 0412 01/27/14 0440 01/28/14 0440 01/30/14 0440  NA 142 143 145 142 143 143  K 3.5* 3.7 4.1 3.8 4.0 4.0  CL 102 108 109 107 108 107  CO2 GLUCOSE 92 90 88 91 89 87  BUN 8 4* 3* 3* 3* 3*  CREATININE 0.62 0.73 0.63 0.66 0.71 0.71  CALCIUM 9.2 8.3* 8.8 8.7 8.7 8.7  MG 2.0  --   --   --   --   --    Liver Function Tests:  Recent Labs Lab 01/24/14 1225 01/25/14 0435  AST 12 10  ALT 11 10  ALKPHOS 106 90  BILITOT <0.2* 0.2*  PROT 7.0 6.0  ALBUMIN 3.2* 2.7*   No results found for this basename: LIPASE, AMYLASE,  in the last 168 hours No results found for this basename: AMMONIA,  in the last 168 hours CBC:  Recent Labs Lab 01/24/14 1225 01/25/14 0435 01/26/14 0412 01/27/14 0440 01/30/14 0440  WBC 8.6 7.6 8.0 8.2 7.9  NEUTROABS 4.6  --   --   --   --   HGB 12.5 11.1* 11.4* 12.0 11.6*  HCT 38.3 34.4* 35.4* 37.8 36.3  MCV 89.1 91.0 90.5 90.6 90.3  PLT 498* 425* 429* 463* 438*   Cardiac Enzymes: No results  found for this basename: CKTOTAL, CKMB, CKMBINDEX, TROPONINI,  in the last 168 hours BNP: BNP (last 3  results) No results found for this basename: PROBNP,  in the last 8760 hours CBG: No results found for this basename: GLUCAP,  in the last 168 hours     Signed:  Irlene Crudup A  [facility] 01/30/2014, 2:48 PM

## 2014-01-30 NOTE — Progress Notes (Signed)
[facility]   Dificid day # 5 Subjective: Feels less well today. BM down to 4 since yesterday, (11 on Friday night in comparison)   Antibiotics:  Anti-infectives   Start     Dose/Rate Route Frequency Ordered Stop   03/02/14 1000  vancomycin (VANCOCIN) 50 mg/mL oral solution 125 mg  Status:  Discontinued     125 mg Oral Every 3 DAYS 01/25/14 1535 01/26/14 1556   02/23/14 1000  vancomycin (VANCOCIN) 50 mg/mL oral solution 125 mg  Status:  Discontinued     125 mg Oral Every 3 DAYS 01/24/14 1507 01/25/14 1535   02/23/14 1000  vancomycin (VANCOCIN) 50 mg/mL oral solution 125 mg  Status:  Discontinued     125 mg Oral Every other day 01/25/14 1535 01/26/14 1556   02/15/14 1000  vancomycin (VANCOCIN) 50 mg/mL oral solution 125 mg  Status:  Discontinued     125 mg Oral Every other day 01/24/14 1507 01/25/14 1535   02/15/14 1000  vancomycin (VANCOCIN) 50 mg/mL oral solution 125 mg  Status:  Discontinued     125 mg Oral Daily 01/25/14 1535 01/26/14 1556   02/08/14 1000  vancomycin (VANCOCIN) 50 mg/mL oral solution 125 mg  Status:  Discontinued     125 mg Oral Daily 01/24/14 1507 01/25/14 1535   02/07/14 2200  vancomycin (VANCOCIN) 50 mg/mL oral solution 125 mg  Status:  Discontinued     125 mg Oral 2 times daily 01/25/14 1535 01/26/14 1556   01/31/14 2200  vancomycin (VANCOCIN) 50 mg/mL oral solution 125 mg  Status:  Discontinued     125 mg Oral 2 times daily 01/24/14 1507 01/25/14 1535   01/30/14 0000  fidaxomicin (DIFICID) 200 MG TABS tablet     200 mg Oral 2 times daily 01/30/14 1218     01/26/14 2200  fidaxomicin (DIFICID) tablet 200 mg     200 mg Oral 2 times daily 01/26/14 1556     01/25/14 1800  vancomycin (VANCOCIN) 50 mg/mL oral solution 500 mg  Status:  Discontinued     500 mg Oral 4 times daily 01/25/14 1535 01/26/14 1556   01/24/14 1515  vancomycin (VANCOCIN) 50 mg/mL oral solution 125 mg  Status:  Discontinued     125 mg Oral 4 times daily 01/24/14  1507 01/25/14 1535   01/24/14 1515  vancomycin (VANCOCIN) 50 mg/mL oral solution 125 mg     125 mg Oral STAT 01/24/14 1509 01/24/14 1828      Medications: Scheduled Meds: . famotidine  20 mg Oral BID  . fidaxomicin  200 mg Oral BID  . hyoscyamine  0.25 mg Oral TID AC & HS  . mesalamine  1,000 mg Rectal QHS  . mesalamine  2.4 g Oral BID  . PARoxetine  20 mg Oral Daily  . saccharomyces boulardii  250 mg Oral BID   Continuous Infusions: . sodium chloride 0.9 % 1,000 mL infusion 75 mL/hr at 01/30/14 0521   PRN Meds:.acetaminophen, acetaminophen, albuterol, loratadine, menthol-cetylpyridinium, morphine injection, ondansetron (ZOFRAN) IV, ondansetron, oxyCODONE    Objective: Weight change: 4.8 oz (0.136 kg)  Intake/Output Summary (Last 24 hours) at 01/30/14 1430 Last data filed at 01/30/14 1006  Gross per 24 hour  Intake   1500 ml  Output      0 ml  Net   1500 ml   Blood pressure 141/78, pulse 68, temperature 98.1 F (36.7 C), temperature source Oral, resp. rate 16, height  (1.6  m), weight 203 lb 6.4 oz (92.262 kg), SpO2 98.00%. Temp:  [98.1 F (36.7 C)-98.4 F (36.9 C)] 98.1 F (36.7 C) (08/24 0659) Pulse Rate:  [68-88] 68 (08/24 0659) Resp:  [16-18] 16 (08/24 0659) BP: (122-141)/(74-78) 141/78 mmHg (08/24 0659) SpO2:  [97 %-98 %] 98 % (08/24 0659) Weight:  [203 lb 6.4 oz (92.262 kg)] 203 lb 6.4 oz (92.262 kg) (08/24 0659)  Physical Exam: General: Alert and awake, oriented x3, not in any acute distress. HEENT:  EOMI CVS regular rate, normal r,   Chest:  no wheezing, rales or rhonchi Abdomen: tender diffusely decreased bowel sounds Skin: no rashes Neuro: nonfocal  CBC:  Recent Labs Lab 01/24/14 1225 01/25/14 0435 01/26/14 0412 01/27/14 0440 01/30/14 0440  HGB 12.5 11.1* 11.4* 12.0 11.6*  HCT 38.3 34.4* 35.4* 37.8 36.3  PLT 498* 425* 429* 463* 438*     BMET  Recent Labs  01/28/14 0440 01/30/14 0440  NA 143 143  K 4.0 4.0  CL 108 107  CO2 26  26  GLUCOSE 89 87  BUN 3* 3*  CREATININE 0.71 0.71  CALCIUM 8.7 8.7     Liver Panel  No results found for this basename: PROT, ALBUMIN, AST, ALT, ALKPHOS, BILITOT, BILIDIR, IBILI,  in the last 72 hours     Sedimentation Rate No results found for this basename: ESRSEDRATE,  in the last 72 hours C-Reactive Protein No results found for this basename: CRP,  in the last 72 hours  Micro Results: Recent Results (from the past 720 hour(s))  CLOSTRIDIUM DIFFICILE BY PCR     Status: Abnormal   Collection Time    01/19/14  2:44 PM      Result Value Ref Range Status   C difficile by pcr Detected (*) Not Detected Final   Comment: This test is for use only with liquid or soft stools;     performance characteristics of other clinical specimen     types have not been established.           This assay was performed by Cepheid GeneXpert(R) PCR.     The performance characteristics of this assay have     been determined by [facility]. Performance     characteristics refer to the analytical performance     of the test.  URINE CULTURE     Status: None   Collection Time    01/24/14  3:47 PM      Result Value Ref Range Status   Specimen Description URINE, RANDOM   Final   Special Requests NONE   Final   Culture  Setup Time     Final   Value: 01/25/2014 01:48     Performed at [facility] Count     Final   Value: 10,000 COLONIES/ML     Performed at [facility]   Culture     Final   Value: Multiple bacterial morphotypes present, none predominant. Suggest appropriate recollection if clinically indicated.     Performed at [facility]   Report Status 01/26/2014 FINAL   Final    Studies/Results: No results found.    Assessment/Plan:  Principal Problem:   Recurrent colitis due to Clostridium difficile Active Problems:   B12 DEFICIENCY   ASTHMA   GERD   HIATAL HERNIA   ULCERATIVE COLITIS, LEFT SIDED   BRBPR (bright red blood per  rectum)   Hypokalemia    Whitney Vance is a 63 y.o.  female with  Recurrent CDI and ulcerative colitis  #1 Recurrent CDI: we ordered Xray to exclude ileus  Patient frustrated with progress.we started DIFICID yesterday  Continue for  5 more days for TEN days total DIFICID  And then would follow this with    vancomycin taper   Vancomycin 125  Mg BID x 7 days ""      QDAILY x 7 days ""   QOD for 7 days ""   Q3 days for 14 days  Avoid PPI  Avoid un-necessary abx       LOS: 6 days   Acey Lav 01/30/2014, 2:30 PM

## 2014-02-21 ENCOUNTER — Ambulatory Visit (INDEPENDENT_AMBULATORY_CARE_PROVIDER_SITE_OTHER): Payer: 59 | Admitting: Internal Medicine

## 2014-02-21 ENCOUNTER — Encounter: Payer: Self-pay | Admitting: Internal Medicine

## 2014-02-21 VITALS — BP 120/88 | HR 80 | Ht 62.0 in | Wt 207.0 lb

## 2014-02-21 DIAGNOSIS — A0472 Enterocolitis due to Clostridium difficile, not specified as recurrent: Secondary | ICD-10-CM

## 2014-02-21 DIAGNOSIS — K5190 Ulcerative colitis, unspecified, without complications: Secondary | ICD-10-CM

## 2014-02-21 DIAGNOSIS — K219 Gastro-esophageal reflux disease without esophagitis: Secondary | ICD-10-CM

## 2014-02-21 DIAGNOSIS — K51919 Ulcerative colitis, unspecified with unspecified complications: Secondary | ICD-10-CM

## 2014-02-21 MED ORDER — RANITIDINE HCL 150 MG PO TABS: 150.0000 mg | ORAL_TABLET | Freq: Two times a day (BID) | ORAL | Status: AC

## 2014-02-21 NOTE — Progress Notes (Signed)
Subjective:    Patient ID: Whitney Vance, female    DOB: [birth year], 63 y.o.   MRN: [id number]  HPI Mrs. Whitney Vance is a 63 year old female with a history of ulcerative proctosigmoiditis, recent refractory C. difficile requiring hospitalization, GERD, B12 deficiency who is seen for followup. She has been having colitis symptoms dating back to June 2015. She had a colonoscopy which was performed on 12/14/2013 which revealed normal terminal ileum, with moderate left-sided colitis and mild colitis in the ascending colon. Biopsy showed chronic active colitis which was mild from the right and left colon. The transverse colon was benign and normal. Shortly after colonoscopy she developed worsening diarrhea and lower abdominal pain and was found to have C. difficile colitis. This required several rounds of treatment, including Flagyl and vancomycin. Dificid was prescribed, but she continued to feel poorly and was becoming dehydrated. She was admitted to the hospital from 01/23/2014 to 01/30/2014.  She was seen by ID as well and completed Dificid and then started oral vancomycin. Since discharge she has been tapering oral vancomycin initially twice a day and she is on her last day of once daily therapy. Tomorrow she plans to start one tablet every other day x7 days and then one tablet every 3 days x14 days.  Fortunately her diarrhea has resolved. She is having 1-2 formed brown stools daily without blood or melena. No further tenesmus. She denies abdominal pain. She does have mild left lower back pain in the morning on occasion which in the past has been associated with her colitis. This is mild. No fevers or chills. Her Prilosec was stopped in the setting of C. difficile infection and she has been using famotidine 20 mg. This has resulted in incomplete heartburn control and she has even used an additional or third dose on some days plus using TUMS. No dysphagia, odynophagia, nausea or vomiting. Weight is  stable.   Review of Systems As per history of present illness, otherwise negative  Current Medications, Allergies, Past Medical History, Past Surgical History, Family History and Social History were reviewed in [employer] [facility] record.     Objective:   Physical Exam BP 120/88  Pulse 80  Ht  (1.575 m)  Wt 207 lb (93.895 kg)  BMI 37.85 kg/m2 Constitutional: Well-developed and well-nourished. No distress. HEENT: Normocephalic and atraumatic. Oropharynx is clear and moist. No oropharyngeal exudate. Conjunctivae are normal.  No scleral icterus. Neck: Neck supple. Trachea midline. Cardiovascular: Normal rate, regular rhythm and intact distal pulses. No M/R/G Pulmonary/chest: Effort normal and breath sounds normal. No wheezing, rales or rhonchi. Abdominal: Soft, obese, nontender, nondistended. Bowel sounds ere are no masses palpable.  Extremities: no clubbing, cyanosis, or edema Neurological: Alert and oriented to person place and time. Psychiatric: Normal mood and affect. Behavior is normal.  CBC    Component Value Date/Time   WBC 7.9 01/30/2014 0440   RBC 4.02 01/30/2014 0440   HGB 11.6* 01/30/2014 0440   HCT 36.3 01/30/2014 0440   PLT 438* 01/30/2014 0440   MCV 90.3 01/30/2014 0440   MCH 28.9 01/30/2014 0440   MCHC 32.0 01/30/2014 0440   RDW 14.7 01/30/2014 0440   LYMPHSABS 2.8 01/24/2014 1225   MONOABS 0.9 01/24/2014 1225   EOSABS 0.4 01/24/2014 1225   BASOSABS 0.1 01/24/2014 1225    CMP     Component Value Date/Time   NA 143 01/30/2014 0440   K 4.0 01/30/2014 0440   CL 107 01/30/2014 0440   CO2  26 01/30/2014 0440   GLUCOSE 87 01/30/2014 0440   BUN 3* 01/30/2014 0440   CREATININE 0.71 01/30/2014 0440   CALCIUM 8.7 01/30/2014 0440   PROT 6.0 01/25/2014 0435   ALBUMIN 2.7* 01/25/2014 0435   AST 10 01/25/2014 0435   ALT 10 01/25/2014 0435   ALKPHOS 90 01/25/2014 0435   BILITOT 0.2* 01/25/2014 0435   GFRNONAA 90* 01/30/2014 0440   GFRAA >90 01/30/2014 0440        Assessment & Plan:  63 year old female with a history of ulcerative proctosigmoiditis, recent refractory C. difficile requiring hospitalization, GERD, B12 deficiency who is seen for followup.   1. C. difficile colitis -- very difficult to control though fortunately it seems like this infection has resolved. We will continue vancomycin taper as directed with every other day dosing for the next 7 days followed by every 3 day dosing for 14 days. Her therapy will complete on 03/16/2014. She will continue Florastor 250 mg twice daily for an additional month. We will keep her off PPI, see below. I've asked that she notify me immediately should she have recurrent diarrhea, abdominal pain and she voices understanding  2. Ulcerative colitis -- initially her history was that of proctosigmoiditis though she did have colitis in the ascending colon on colonoscopy in July. She has been maintained on Lialda 4.8 g daily and seems to be tolerating this well. She was on Canasa but ran out recently. Given the overall improvement and lack of rectal symptoms, we will stop Canasa for now. Continue Lialda 4.8 g daily.  3. GERD -- worsened off PPI without alarm symptoms. Will change to ranitidine to see if this helps more completely. 150 mg every 12 hours, okay to add a third dose in the middle of the day if necessary. Call if reflux symptoms not improving or worsening.  Return in 8 weeks for followup

## 2014-02-21 NOTE — Patient Instructions (Signed)
Continue Lialda as directed. Finish Vancomycin taper as directed. Stop Canasa, Uceris, and Pepcid. Start taking Ranitidine 150 mg twice daily, prescription has been sent to the pharmacy. Follow up in 8 weeks with Dr Rhea Belton, we have scheduled you for 05/01/14 8:45 am. Call if this will not work for you. CC:  Betty [place] MD

## 2014-03-22 ENCOUNTER — Telehealth: Payer: Self-pay | Admitting: Internal Medicine

## 2014-03-22 DIAGNOSIS — R197 Diarrhea, unspecified: Secondary | ICD-10-CM

## 2014-03-22 MED ORDER — VANCOMYCIN HCL 250 MG PO CAPS
250.0000 mg | ORAL_CAPSULE | Freq: Four times a day (QID) | ORAL | Status: DC
Start: 2014-03-22 — End: 2014-04-10

## 2014-03-22 NOTE — Telephone Encounter (Signed)
Oral vancomycin 250 mg QID x 14 days Repeat c diff PCR for confirmation May need to discuss fecal transplantation

## 2014-03-22 NOTE — Telephone Encounter (Signed)
Spoke with pt and she is aware. Script sent to the pharmacy and order in epic.

## 2014-03-22 NOTE — Telephone Encounter (Signed)
Pt states she thinks she has cdiff again. States she had 5 diarrhea stools this am and she has started to see blood in the stool. States the last stool she had she thought she was going to vomit while on the toilet. Pt reports this is exactly how she felt last time. Please advise.

## 2014-03-24 ENCOUNTER — Other Ambulatory Visit: Payer: 59

## 2014-03-24 DIAGNOSIS — R197 Diarrhea, unspecified: Secondary | ICD-10-CM

## 2014-03-26 LAB — CLOSTRIDIUM DIFFICILE BY PCR: Toxigenic C. Difficile by PCR: DETECTED — CR

## 2014-03-27 ENCOUNTER — Telehealth: Payer: Self-pay | Admitting: Internal Medicine

## 2014-03-27 ENCOUNTER — Other Ambulatory Visit: Payer: Self-pay

## 2014-03-27 DIAGNOSIS — A0472 Enterocolitis due to Clostridium difficile, not specified as recurrent: Secondary | ICD-10-CM

## 2014-03-27 NOTE — Telephone Encounter (Signed)
Referral entered for pt to see Dr. Drue SecondSnider for recurrent cdiff and consideration of fecal transplant. Called office to make sure referral is in system, referral confirmed. Office to call pt with appt.

## 2014-04-10 ENCOUNTER — Telehealth: Payer: Self-pay | Admitting: Internal Medicine

## 2014-04-10 DIAGNOSIS — R197 Diarrhea, unspecified: Secondary | ICD-10-CM

## 2014-04-10 MED ORDER — VANCOMYCIN HCL 250 MG PO CAPS: ORAL_CAPSULE | ORAL | Status: DC

## 2014-04-10 NOTE — Telephone Encounter (Signed)
Patient states that her diarrhea is somewhat better. She is still having difficulty in the mornings. I just want to be certain... are you saying you want an additional vanco taper for 3 weeks? She has f/u with ID on 04/18/14

## 2014-04-10 NOTE — Telephone Encounter (Signed)
We restarted oral vanco on 03/27/14 for recurrence of c diff (250 mg PO QID x 14 day).  Best to taper from there (if diarrhea is better).  Would do 250 mg BID x 1 week, then 250 once daily x 1 weeks, then 250 mg every other day x 1 week. Need ID followup with Drue SecondSnider Is diarrhea better?

## 2014-04-10 NOTE — Telephone Encounter (Signed)
Yes, on the taper as described

## 2014-04-10 NOTE — Telephone Encounter (Signed)
Patient advised of Dr Lauro FranklinPyrtle's recommendation and verbalizes understanding. [facility].

## 2014-04-18 ENCOUNTER — Ambulatory Visit (INDEPENDENT_AMBULATORY_CARE_PROVIDER_SITE_OTHER): Payer: 59 | Admitting: Internal Medicine

## 2014-04-18 VITALS — BP 120/80 | HR 85 | Temp 98.3°F | Wt 209.0 lb

## 2014-04-18 DIAGNOSIS — K51911 Ulcerative colitis, unspecified with rectal bleeding: Secondary | ICD-10-CM

## 2014-04-18 DIAGNOSIS — A0471 Enterocolitis due to Clostridium difficile, recurrent: Secondary | ICD-10-CM

## 2014-04-18 DIAGNOSIS — A047 Enterocolitis due to Clostridium difficile: Secondary | ICD-10-CM

## 2014-04-18 DIAGNOSIS — K219 Gastro-esophageal reflux disease without esophagitis: Secondary | ICD-10-CM

## 2014-04-18 NOTE — Progress Notes (Signed)
Subjective:    Patient ID: Whitney Vance, female    DOB: June 16, 1950, 63 y.o.   MRN: [id number]  HPI  62yo F with history of UC diagnosed 25 years ago under good control until this summer. She states that she was in good state of health up until January 2015 Bronchitis, flu, strep throat received antibiotics at that time. She then had  right foot surgery in April.  In late May/early June, she started to have watery diarrhea that was diagnosed as c.difficile infection. She was treated with metronidazole, without improvement, then followed by fidaxomicin which improved. Her Cdifficile infection exacerbated her UC, where she underwent CSY which showed moderate left sided colitis, mild colicits in ascending colon. She was started on uceris 9 mg daily and lialda 4.9gm daily. She started to have worsening bloody, watery diarrhea in mid July which was her 2nd episode where she required  ED visit to manage dehydration with vomiting. She was admitted in late August for refractory c.difficile infection. Initially treated with high dose vanco 500mg  but had slow response thus changed over the dificid, followed by a prolonged vancomycin taper that lasted to mid October. She states that she did return to have 2-3 formed bowel movements for roughly a week but then noticed having  having abdominal cramping, frequent watery BM, foul smelling stool. Retested again + cdiifficile in Oct 2015. Now restarted back to oral vancomycin. 250mg  QID, followed by 250mg  BID, then down to a taper to 250mg  a day x 1 wk, whic she just started. She is still going to the bathroom 4-5 BM per day, intermittently formed. Worse in the morning, fecal urgency.  Family resides in [place] who would be her potential donors for FMT  Allergies  Allergen Reactions  . Demerol [Meperidine] Other (See Comments)    Hallucinations / nervousness / falling spells   Current Outpatient Prescriptions on File Prior to Visit  Medication Sig Dispense  Refill  . aspirin EC 81 MG tablet Take 81 mg by mouth daily.    . Glucosamine-Chondroit-Vit C-Mn (GLUCOSAMINE 1500 COMPLEX) CAPS Take by mouth 2 (two) times daily.    Marland Kitchen. PARoxetine (PAXIL) 20 MG tablet Take 20 mg by mouth daily.    . ranitidine (ZANTAC) 150 MG tablet Take 1 tablet (150 mg total) by mouth 2 (two) times daily. 90 tablet 5  . saccharomyces boulardii (FLORASTOR) 250 MG capsule Take 1 capsule (250 mg total) by mouth 2 (two) times daily. 60 capsule 0  . vancomycin (VANCOCIN) 250 MG capsule Take 1 tablet twice daily x 1 week, then take 1 tablet once daily x 1 week, then take 1 tablet every other day x 1 week. Then, discontinue 25 capsule 0  . mesalamine (LIALDA) 1.2 G EC tablet Take 1.2 g by mouth daily with breakfast.     No current facility-administered medications on file prior to visit.   Active Ambulatory Problems    Diagnosis Date Noted  . B12 DEFICIENCY 06/28/2007  . ALLERGIC RHINITIS 06/28/2007  . ASTHMA 06/28/2007  . GERD 06/28/2007  . HIATAL HERNIA 06/28/2007  . ULCERATIVE COLITIS, LEFT SIDED 06/07/2001  . ADVERSE DRUG REACTION, LATE EFFECT 02/29/2008  . Recurrent colitis due to Clostridium difficile 01/24/2014  . BRBPR (bright red blood per rectum) 01/24/2014  . Hypokalemia 01/24/2014   Resolved Ambulatory Problems    Diagnosis Date Noted  . Clostridium difficile colitis 01/24/2014   Past Medical History  Diagnosis Date  . Vitamin B 12 deficiency   . Ulcerative  colitis   . Hiatal hernia   . GERD (gastroesophageal reflux disease)   . H/O Clostridium difficile infection 11/2013, 01/2014  . Depression   . Asthma    History  Substance Use Topics  . Smoking status: Former Smoker -- 20 years    Types: Cigarettes    Quit date: 06/09/1986  . Smokeless tobacco: Never Used  . Alcohol Use: No  family history includes Ulcerative colitis in her mother.  Review of Systems +diarrhea, loss of appetite, 10Lb weight loss in august, now stable. 10 point ros is  negative    Objective:   Physical Exam BP 120/80 mmHg  Pulse 85  Temp(Src) 98.3 F (36.8 C) (Oral)  Wt 209 lb (94.802 kg) Physical Exam  Constitutional:  oriented to person, place, and time. appears well-developed and well-nourished. No distress.  HENT:  Mouth/Throat: Oropharynx is clear and moist. No oropharyngeal exudate.  Cardiovascular: Normal rate, regular rhythm and normal heart sounds. Exam reveals no gallop and no friction rub.  No murmur heard.  Pulmonary/Chest: Effort normal and breath sounds normal. No respiratory distress.  has no wheezes.  Abdominal: Soft. Bowel sounds are normal.  exhibits no distension. There is no tenderness.  Psychiatric: a normal mood and affect. behavior is normal.       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 2nd bout in aug that led to her hospitalization where she was placed on prolonged vancomycin taper, where she had possibly 1-2 wk of normal bowel movement,s and 3rd episode in October for which she is on 2nd vancomycin taper, for which she is currently on once a day dosing.continue with florastor   - a few options can offer: Would be a good candidate for FMT, discussed how is it considered experimental and would need consent. Would need a stool donor. Presently we would not necessarily endorse stool from [facility] but would need to do recent testing on blood and stool of their intended donor.  - treatment wise : can see if she relapses after this taper. Would consider using a different taper after either treating with vancomycin high dose vs. fidaxamicin then do a taper with rifaximin.  - for preparation of FMT, I have asked them to find a donor. We would keep her on vancomycin 125mg  BID up until 2 days prior to doing the FMT via colonoscopy.  - uc being managed by dr. Rhea BeltonPyrtle. Continue on mesalamine  - gerd = try to  avoid PPI continue on ranitidine.  Will coordinate with Dr. Rhea BeltonPyrtle.   She would bWill do currently   Will do rifaxamin  Taper if still having

## 2014-04-24 ENCOUNTER — Ambulatory Visit: Payer: 59 | Admitting: Infectious Diseases

## 2014-04-28 ENCOUNTER — Encounter: Payer: Self-pay | Admitting: *Deleted

## 2014-05-01 ENCOUNTER — Encounter: Payer: Self-pay | Admitting: Internal Medicine

## 2014-05-01 ENCOUNTER — Ambulatory Visit (INDEPENDENT_AMBULATORY_CARE_PROVIDER_SITE_OTHER): Payer: 59 | Admitting: Internal Medicine

## 2014-05-01 VITALS — BP 128/86 | HR 68 | Ht 63.0 in | Wt 208.0 lb

## 2014-05-01 DIAGNOSIS — K219 Gastro-esophageal reflux disease without esophagitis: Secondary | ICD-10-CM

## 2014-05-01 DIAGNOSIS — A047 Enterocolitis due to Clostridium difficile: Secondary | ICD-10-CM

## 2014-05-01 DIAGNOSIS — A0471 Enterocolitis due to Clostridium difficile, recurrent: Secondary | ICD-10-CM

## 2014-05-01 DIAGNOSIS — K5150 Left sided colitis without complications: Secondary | ICD-10-CM

## 2014-05-01 NOTE — Progress Notes (Signed)
   Subjective:    Patient ID: Whitney Vance, female    DOB: [birth year], 63 y.o.   MRN: [id number]  HPI Ranyah Groeneveld is a 63 yo female with PMH of recurrent C. difficile colitis, ulcerative proctosigmoiditis, GERD, B12 deficiency who is seen in follow-up. Over the last 4-5 months she's had 3 episodes of recurrent C. difficile infection. This was treated with metronidazole without improvement followed by dificid.  She then had exacerbation of her left-sided colitis. Colonoscopy showed moderate left-sided colitis and mild colitis in the ascending colon. She was treated with colonic release budesonide and Lialda 4.8 g daily. She required admission to the hospital in August for refractory C. difficile infection. She had recurrence in mid October. We did a very prolonged vancomycin taper and she completed every other day vancomycin yesterday.  Today she reports she is feeling as well as she has in months. She is having 2-3 formed stools daily. No abdominal pain. No blood in her stool. No tenesmus. Occasionally bowel movement she has mild nausea and sick stomach but no vomiting. No fevers or chills. Good appetite. She is off PPI and using ranitidine twice daily. This doesn't control GERD as well as PPI, but she is getting by. No dysphagia or odynophagia.  She plans to move to [place] but is waiting on prolonged period of wellness being free from C. difficile before moving  She is recently seen Dr. Baxter Flattery with ID. she recommended consideration of FMT  Review of Systems As per HPI, otherwise negative  Current Medications, Allergies, Past Medical History, Past Surgical History, Family History and Social History were reviewed in [place] record.     Objective:   Physical Exam BP 128/86 mmHg  Pulse 68  Ht $R'5\' 3"'ez$  (1.6 m)  Wt 208 lb (94.348 kg)  BMI 36.85 kg/m2 Constitutional: Well-developed and well-nourished. No distress. HEENT: Normocephalic and atraumatic. Oropharynx is  clear and moist. No oropharyngeal exudate. Conjunctivae are normal.  No scleral icterus. Neck: Neck supple. Trachea midline. Cardiovascular: Normal rate, regular rhythm and intact distal pulses. No M/R/G Pulmonary/chest: Effort normal and breath sounds normal. No wheezing, rales or rhonchi. Abdominal: Soft, nontender, nondistended. Bowel sounds active throughout. Extremities: no clubbing, cyanosis, or trace pretibial edema Lymphadenopathy: No cervical adenopathy noted. Neurological: Alert and oriented to person place and time. Skin: Skin is warm and dry. No rashes noted. Psychiatric: Normal mood and affect. Behavior is normal.      Assessment & Plan:  63 yo female with PMH of recurrent C. difficile colitis, ulcerative proctosigmoiditis, GERD, B12 deficiency who is seen in follow-up.  1. Refractory C. Difficile colitis -- she has just stopped vancomycin and so it is too early to tell whether or not she will recur. I very much hope she can remain C. difficile free, however if it does recur I agree with fecal transplantation. She is searching for a donor, she does not feel her husband is a donor because he is on antibiotics. We will work with Dr. Baxter Flattery to arrange if necessary. If diarrhea returns she already has the stool collection kit given by ID clinic. Would likely treat with high-dose vancomycin versus Dificid followed by taper with rifaximin. I asked that she notify me should she develop recurrent diarrhea  2. Left-sided colitis -- continue Lialda 4.8 g daily. Avoid NSAIDs  3. GERD -- will try to avoid PPI, continue ranitidine twice daily  Follow-up in 3 months unless recurrence sooner

## 2014-05-01 NOTE — Patient Instructions (Addendum)
Please continue Lialda 4.8 grams every day.  Please continue ranitidine twice daily.  Please call our office should you have recurrent diarrhea or C.Diff symptoms.  CC:Dr Betty SwazilandJordan, Dr Judyann Munsonynthia Snider

## 2014-05-12 ENCOUNTER — Other Ambulatory Visit: Payer: Self-pay | Admitting: *Deleted

## 2014-05-12 ENCOUNTER — Other Ambulatory Visit: Payer: 59

## 2014-05-12 DIAGNOSIS — A0472 Enterocolitis due to Clostridium difficile, not specified as recurrent: Secondary | ICD-10-CM

## 2014-05-14 ENCOUNTER — Telehealth: Payer: Self-pay | Admitting: Infectious Disease

## 2014-05-14 LAB — CLOSTRIDIUM DIFFICILE BY PCR: CDIFFPCR: DETECTED — AB

## 2014-05-14 MED ORDER — VANCOMYCIN HCL 125 MG PO CAPS: 125.0000 mg | ORAL_CAPSULE | Freq: Four times a day (QID) | ORAL | Status: DC

## 2014-05-14 NOTE — Telephone Encounter (Signed)
pts C diff was called in positive to me last night. I spoke with pt and she agreed to start vancomycin. I called it in to pharmacy [facility]. I have sent in again electronically today

## 2014-05-16 LAB — STOOL CULTURE

## 2014-05-17 NOTE — Telephone Encounter (Signed)
I will call her to see she has vancomycin plus will discuss FMT with Dr. Rhea BeltonPyrtle

## 2014-05-18 ENCOUNTER — Encounter: Payer: Self-pay | Admitting: Internal Medicine

## 2014-05-18 ENCOUNTER — Ambulatory Visit (INDEPENDENT_AMBULATORY_CARE_PROVIDER_SITE_OTHER): Payer: 59 | Admitting: Internal Medicine

## 2014-05-18 ENCOUNTER — Ambulatory Visit: Payer: 59 | Admitting: Internal Medicine

## 2014-05-18 VITALS — BP 127/81 | HR 76 | Temp 97.7°F | Wt 206.0 lb

## 2014-05-18 DIAGNOSIS — A0472 Enterocolitis due to Clostridium difficile, not specified as recurrent: Secondary | ICD-10-CM

## 2014-05-18 DIAGNOSIS — A047 Enterocolitis due to Clostridium difficile: Secondary | ICD-10-CM

## 2014-05-18 LAB — BASIC METABOLIC PANEL WITH GFR
BUN: 10 mg/dL (ref 6–23)
CO2: 28 meq/L (ref 19–32)
Calcium: 9.4 mg/dL (ref 8.4–10.5)
Chloride: 102 mEq/L (ref 96–112)
Creat: 0.7 mg/dL (ref 0.50–1.10)
GFR, Est African American: >89 mL/min
GFR, Est Non African American: >89 mL/min
GLUCOSE: 89 mg/dL (ref 70–99)
POTASSIUM: 5.1 meq/L (ref 3.5–5.3)
SODIUM: 139 meq/L (ref 135–145)

## 2014-05-18 LAB — CBC WITH DIFFERENTIAL/PLATELET
Basophils Absolute: 0.1 10*3/uL (ref 0.0–0.1)
Basophils Relative: 1 % (ref 0–1)
Eosinophils Absolute: 0.6 10*3/uL (ref 0.0–0.7)
Eosinophils Relative: 7 % — ABNORMAL HIGH (ref 0–5)
HEMATOCRIT: 37.5 % (ref 36.0–46.0)
Hemoglobin: 12.3 g/dL (ref 12.0–15.0)
LYMPHS ABS: 3.2 10*3/uL (ref 0.7–4.0)
Lymphocytes Relative: 35 % (ref 12–46)
MCH: 28.3 pg (ref 26.0–34.0)
MCHC: 32.8 g/dL (ref 30.0–36.0)
MCV: 86.4 fL (ref 78.0–100.0)
MONOS PCT: 7 % (ref 3–12)
MPV: 8.2 fL — ABNORMAL LOW (ref 9.4–12.4)
Monocytes Absolute: 0.6 10*3/uL (ref 0.1–1.0)
NEUTROS ABS: 4.5 10*3/uL (ref 1.7–7.7)
NEUTROS PCT: 50 % (ref 43–77)
Platelets: 569 10*3/uL — ABNORMAL HIGH (ref 150–400)
RBC: 4.34 MIL/uL (ref 3.87–5.11)
RDW: 14.2 % (ref 11.5–15.5)
WBC: 9 10*3/uL (ref 4.0–10.5)

## 2014-05-18 LAB — MAGNESIUM: Magnesium: 2 mg/dL (ref 1.5–2.5)

## 2014-05-18 NOTE — Progress Notes (Signed)
Subjective:    Patient ID: Whitney Vance, female    DOB: 1950-10-21, 63 y.o.   MRN: [id number]  HPI 63yo F with recurrent cdifficile. Started having worsening diarrhea on dec 1st and feeling poorly. Started to notice blood in watery stool with occ. Clots. She was retested on 12/4 cdiff +. She was started on oral vanco tabs 125mg  QID, now on 4th day. Having some improvement from 15 BM per day down to 7 BM daily. Still feeling poorly. No longer seeing blood in stool. Maintaining adequate oral intake. Still urinating 4 x per day. occ nausea.    Current Outpatient Prescriptions on File Prior to Visit  Medication Sig Dispense Refill  . aspirin EC 81 MG tablet Take 81 mg by mouth daily.    . Glucosamine-Chondroit-Vit C-Mn (GLUCOSAMINE 1500 COMPLEX) CAPS Take by mouth 2 (two) times daily.    . mesalamine (LIALDA) 1.2 G EC tablet Take 1.2 g by mouth daily with breakfast.    . PARoxetine (PAXIL) 20 MG tablet Take 20 mg by mouth daily.    . ranitidine (ZANTAC) 150 MG tablet Take 1 tablet (150 mg total) by mouth 2 (two) times daily. 90 tablet 5  . ranitidine (ZANTAC) 150 MG tablet     . vancomycin (VANCOCIN) 125 MG capsule Take 1 capsule (125 mg total) by mouth 4 (four) times daily. 56 capsule 2   No current facility-administered medications on file prior to visit.   Active Ambulatory Problems    Diagnosis Date Noted  . B12 DEFICIENCY 06/28/2007  . ALLERGIC RHINITIS 06/28/2007  . ASTHMA 06/28/2007  . GERD 06/28/2007  . HIATAL HERNIA 06/28/2007  . ULCERATIVE COLITIS, LEFT SIDED 06/07/2001  . ADVERSE DRUG REACTION, LATE EFFECT 02/29/2008  . Recurrent colitis due to Clostridium difficile 01/24/2014  . BRBPR (bright red blood per rectum) 01/24/2014  . Hypokalemia 01/24/2014   Resolved Ambulatory Problems    Diagnosis Date Noted  . Clostridium difficile colitis 01/24/2014   Past Medical History  Diagnosis Date  . Vitamin B 12 deficiency   . Ulcerative colitis   . Hiatal hernia   . GERD  (gastroesophageal reflux disease)   . H/O Clostridium difficile infection 11/2013, 01/2014  . Depression   . Asthma   . Fatty liver      Review of Systems Review of Systems  Constitutional: positivefor activity change, appetite change, fatigue and no unexpected weight change.  HENT: Negative for congestion, sore throat, rhinorrhea, sneezing, trouble swallowing and sinus pressure.  Eyes: Negative for photophobia and visual disturbance.  Respiratory: Negative for cough, chest tightness, shortness of breath, wheezing and stridor.  Cardiovascular: Negative for chest pain, palpitations and leg swelling.  Gastrointestinal: positive for nausea, vomiting, mild abdominal pain, diarrhea, but no constipation, blood in stool, abdominal distention and anal bleeding.  Genitourinary: Negative for dysuria, hematuria, flank pain and difficulty urinating.  Musculoskeletal: Negative for myalgias, back pain, joint swelling, arthralgias and gait problem.  Skin: Negative for color change, pallor, rash and wound.  Neurological: Negative for dizziness, tremors, weakness and light-headedness.  Hematological: Negative for adenopathy. Does not bruise/bleed easily.  Psychiatric/Behavioral: Negative for behavioral problems, confusion, sleep disturbance, dysphoric mood, decreased concentration and agitation.       Objective:   Physical Exam BP 127/81 mmHg  Pulse 76  Temp(Src) 97.7 F (36.5 C) (Oral)  Wt 206 lb (93.441 kg) Physical Exam  Constitutional:  oriented to person, place, and time. appears well-developed and well-nourished. No distress.  HENT:  Mouth/Throat: Oropharynx is  clear and moist. No oropharyngeal exudate.  Cardiovascular: Normal rate, regular rhythm and normal heart sounds. Exam reveals no gallop and no friction rub.  No murmur heard.  Pulmonary/Chest: Effort normal and breath sounds normal. No respiratory distress.  has no wheezes.  Abdominal: Soft. Bowel sounds are normal.  exhibits no  distension. There is no tenderness.  Lymphadenopathy: no cervical adenopathy.  Neurological: alert and oriented to person, place, and time.  Skin: Skin is warm and dry. No rash noted. No erythema.  Psychiatric: tearful         Assessment & Plan:  Will have her continue with vanco 125mg  QID and then plan on taper. In the meantime, will set up for FMT since this is her 3rd episode this year/ 6 months. She has few options for donor. She is open to using stool from [facility].  Will discuss with dr. Rhea Beltonpyrtle or gessner. Anticipate to try to get FMT by end of December  rtc in 4 wk

## 2014-05-18 NOTE — Telephone Encounter (Signed)
Thanks Cyn! 

## 2014-06-05 ENCOUNTER — Telehealth: Payer: Self-pay | Admitting: *Deleted

## 2014-06-05 ENCOUNTER — Telehealth: Payer: Self-pay | Admitting: Internal Medicine

## 2014-06-05 NOTE — Telephone Encounter (Signed)
Patient notified of appointment dates. [facility] at [facility] AM. Pre visit on 06/06/14 at 8:30 AM. She will take Imodium AD 2 tablets on at 11:00 on Wednesday.

## 2014-06-05 NOTE — Telephone Encounter (Signed)
Fecal Transplant scheduling? Pt thought it was this week.  Please call the pt.  [phone]. MD will call the patient.

## 2014-06-05 NOTE — Telephone Encounter (Signed)
Please ask her to take 2 Imodium AD tablets at about 11 AM Wed 12/30 also I have already told her to stop vancomycin

## 2014-06-05 NOTE — Telephone Encounter (Signed)
Called her about the possible FMT Wed 12/30 - she is advised to stop vancomycin and will hear from me again by tomorrow.

## 2014-06-05 NOTE — Telephone Encounter (Signed)
Need to call her and endo  and set up colonoscopy for 1PM at [facility] 12/30  Moderate sedation is ok would use MAC if available  MiraLax prep -    Indication is C diff colitis  She will be getting Fecal Microbiotica Transplant

## 2014-06-06 ENCOUNTER — Ambulatory Visit (AMBULATORY_SURGERY_CENTER): Payer: Self-pay | Admitting: *Deleted

## 2014-06-06 ENCOUNTER — Encounter (HOSPITAL_COMMUNITY): Payer: Self-pay | Admitting: *Deleted

## 2014-06-06 VITALS — Ht 62.5 in | Wt 209.4 lb

## 2014-06-06 DIAGNOSIS — A0472 Enterocolitis due to Clostridium difficile, not specified as recurrent: Secondary | ICD-10-CM

## 2014-06-06 DIAGNOSIS — A047 Enterocolitis due to Clostridium difficile: Secondary | ICD-10-CM

## 2014-06-06 NOTE — Progress Notes (Signed)
No egg or soy allergy. ewm No home 02 use. ewm No dit pills. ewm Pt states she has no issues with past sedation but cannot use demerol. She fell with the last use after surgery. ewm Pt states her stool [place] coming from a stool bank in [place] as per her MD's. ewm

## 2014-06-06 NOTE — Progress Notes (Signed)
Stop Bang Assessment sent to PCP. [facility] done at [facility] [place], 01[place]/14. Results can be seen in Care Everywhere.

## 2014-06-06 NOTE — Progress Notes (Signed)
   06/06/14 1134  OBSTRUCTIVE SLEEP APNEA  Have you ever been diagnosed with sleep apnea through a sleep study? No  Do you snore loudly (loud enough to be heard through closed doors)?  1  Do you often feel tired, fatigued, or sleepy during the daytime? 1  Has anyone observed you stop breathing during your sleep? 0  Do you have, or are you being treated for high blood pressure? 0  BMI more than 35 kg/m2? 1  Age over 64 years old? 1  Gender: 0  Obstructive Sleep Apnea Score 4  Score 4 or greater  Results sent to PCP

## 2014-06-07 ENCOUNTER — Ambulatory Visit (HOSPITAL_COMMUNITY): Payer: 59 | Admitting: Certified Registered"

## 2014-06-07 ENCOUNTER — Encounter (HOSPITAL_COMMUNITY): Payer: Self-pay | Admitting: *Deleted

## 2014-06-07 ENCOUNTER — Encounter (HOSPITAL_COMMUNITY)
Admission: RE | Disposition: A | Discharge status: Home / Self Care | Payer: Self-pay | Source: Ambulatory Visit | Attending: Internal Medicine

## 2014-06-07 ENCOUNTER — Ambulatory Visit (HOSPITAL_COMMUNITY)
Admission: RE | Admit: 2014-06-07 | Discharge: 2014-06-07 | Disposition: A | Discharge status: Home / Self Care | Payer: 59 | Source: Ambulatory Visit | Attending: Internal Medicine | Admitting: Internal Medicine

## 2014-06-07 DIAGNOSIS — J45909 Unspecified asthma, uncomplicated: Secondary | ICD-10-CM | POA: Diagnosis not present

## 2014-06-07 DIAGNOSIS — E538 Deficiency of other specified B group vitamins: Secondary | ICD-10-CM | POA: Diagnosis not present

## 2014-06-07 DIAGNOSIS — Z79899 Other long term (current) drug therapy: Secondary | ICD-10-CM | POA: Diagnosis not present

## 2014-06-07 DIAGNOSIS — F329 Major depressive disorder, single episode, unspecified: Secondary | ICD-10-CM | POA: Insufficient documentation

## 2014-06-07 DIAGNOSIS — M1990 Unspecified osteoarthritis, unspecified site: Secondary | ICD-10-CM | POA: Insufficient documentation

## 2014-06-07 DIAGNOSIS — Z87891 Personal history of nicotine dependence: Secondary | ICD-10-CM | POA: Insufficient documentation

## 2014-06-07 DIAGNOSIS — A047 Enterocolitis due to Clostridium difficile: Secondary | ICD-10-CM

## 2014-06-07 DIAGNOSIS — A0471 Enterocolitis due to Clostridium difficile, recurrent: Secondary | ICD-10-CM | POA: Diagnosis present

## 2014-06-07 DIAGNOSIS — Z862 Personal history of diseases of the blood and blood-forming organs and certain disorders involving the immune mechanism: Secondary | ICD-10-CM | POA: Diagnosis not present

## 2014-06-07 DIAGNOSIS — Z7982 Long term (current) use of aspirin: Secondary | ICD-10-CM | POA: Diagnosis not present

## 2014-06-07 DIAGNOSIS — K5150 Left sided colitis without complications: Secondary | ICD-10-CM | POA: Diagnosis present

## 2014-06-07 DIAGNOSIS — K219 Gastro-esophageal reflux disease without esophagitis: Secondary | ICD-10-CM | POA: Diagnosis not present

## 2014-06-07 DIAGNOSIS — Z8701 Personal history of pneumonia (recurrent): Secondary | ICD-10-CM | POA: Insufficient documentation

## 2014-06-07 HISTORY — DX: Unspecified osteoarthritis, unspecified site: M19.90

## 2014-06-07 HISTORY — DX: Pneumonia, unspecified organism: J18.9

## 2014-06-07 HISTORY — PX: FECAL TRANSPLANT: SHX6383

## 2014-06-07 HISTORY — PX: COLONOSCOPY WITH PROPOFOL: SHX5780

## 2014-06-07 HISTORY — DX: Anemia, unspecified: D64.9

## 2014-06-07 SURGERY — COLONOSCOPY WITH PROPOFOL
Anesthesia: Monitor Anesthesia Care

## 2014-06-07 SURGERY — COLONOSCOPY
Anesthesia: Moderate Sedation

## 2014-06-07 MED ORDER — MIDAZOLAM HCL 5 MG/5ML IJ SOLN
INTRAMUSCULAR | Status: DC | PRN
  Administered 2014-06-07: 2 mg via INTRAVENOUS

## 2014-06-07 MED ORDER — LACTATED RINGERS IV SOLN
INTRAVENOUS | Status: DC
Start: 2014-06-07 — End: 2014-06-07

## 2014-06-07 MED ORDER — FENTANYL CITRATE 0.05 MG/ML IJ SOLN
INTRAMUSCULAR | Status: DC | PRN
  Administered 2014-06-07: 50 ug via INTRAVENOUS
  Administered 2014-06-07 (×2): 25 ug via INTRAVENOUS

## 2014-06-07 MED ORDER — SODIUM CHLORIDE 0.9 % IV SOLN: INTRAVENOUS | Status: DC

## 2014-06-07 MED ORDER — PROPOFOL INFUSION 10 MG/ML OPTIME
INTRAVENOUS | Status: DC | PRN
  Administered 2014-06-07: 100 ug/kg/min via INTRAVENOUS

## 2014-06-07 MED ORDER — LACTATED RINGERS IV SOLN
INTRAVENOUS | Status: DC | PRN
  Administered 2014-06-07: 14:00:00 via INTRAVENOUS

## 2014-06-07 MED ORDER — LACTATED RINGERS IV SOLN: INTRAVENOUS | Status: DC

## 2014-06-07 SURGICAL SUPPLY — 1 items: stool ×2 IMPLANT

## 2014-06-07 NOTE — Discharge Instructions (Signed)
° °  The procedure was successful. You have some mildly active colitis in the sigmoid colon - that is the ulcerative colitis. Let;s see what happens!  Take 2 imodium when you get home please.  YOU HAD AN ENDOSCOPIC PROCEDURE TODAY: Refer to the procedure report and other information in the discharge instructions given to you for any specific questions about what was found during the examination. If this information does not answer your questions, please call Dr. Marvell FullerGessner's office at [phone] to clarify.   YOU SHOULD EXPECT: Some feelings of bloating in the abdomen. Passage of more gas than usual. Walking can help get rid of the air that was put into your GI tract during the procedure and reduce the bloating. If you had a lower endoscopy (such as a colonoscopy or flexible sigmoidoscopy) you may notice spotting of blood in your stool or on the toilet paper. Some abdominal soreness may be present for a day or two, also.  DIET: Your first meal following the procedure should be a light meal and then it is ok to progress to your normal diet. A half-sandwich or bowl of soup is an example of a good first meal. Heavy or fried foods are harder to digest and may make you feel nauseous or bloated. Drink plenty of fluids but you should avoid alcoholic beverages for 24 hours.   ACTIVITY: Your care partner should take you home directly after the procedure. You should plan to take it easy, moving slowly for the rest of the day. You can resume normal activity the day after the procedure however YOU SHOULD NOT DRIVE, use power tools, machinery or perform tasks that involve climbing or major physical exertion for 24 hours (because of the sedation medicines used during the test).   SYMPTOMS TO REPORT IMMEDIATELY: A gastroenterologist can be reached at any hour. Please call [phone]  for any of the following symptoms:  Following lower endoscopy (colonoscopy, flexible sigmoidoscopy) Excessive amounts of blood  in the stool  Significant tenderness, worsening of abdominal pains  Swelling of the abdomen that is new, acute  Fever of 100 or higher   FOLLOW UP:  If any biopsies were taken you will be contacted by phone or by letter within the next 1-3 weeks. Call [phone]  if you have not heard about the biopsies in 3 weeks.  Please also call with any specific questions about appointments or follow up tests.

## 2014-06-07 NOTE — Op Note (Signed)
[facility] [address] [place], [id number]   COLONOSCOPY PROCEDURE REPORT  PATIENT: Whitney Vance, Whitney Vance  MR#: [id number] BIRTHDATE: 11/07/1950 , 63  yrs. old GENDER: female ENDOSCOPIST: Whitney Vance, Whitney Vance, [facility] PROCEDURE DATE:  06/07/2014 PROCEDURE:   Colonoscopy, with fecal microbiotica transplant First Screening Colonoscopy - Avg.  risk and is 50 yrs.  old or older - No. ASA CLASS:   Class II INDICATIONS:fecal microbiotica transplant. MEDICATIONS: Per Anesthesia and Monitored anesthesia care  DESCRIPTION OF PROCEDURE:   After the risks benefits and alternatives of the procedure were thoroughly explained, informed consent was obtained.  The digital rectal exam revealed no abnormalities of the rectum.   The [employer] Adult Colon [id number] endoscope was introduced through the anus and advanced to the terminal ileum which was intubated for a short distance. No adverse events experienced.   The quality of the prep was excellent, using MiraLax  The instrument was then slowly withdrawn as the colon was fully examined.      COLON FINDINGS: 1) small segment of active UC in distal sigmoid - aphthae and granular mucosa - mild 2) normal terminal ileum - could see but not enter 3) otherwise normal colonoscopy limited inspection - 250 cc [facility] donor 82 liquid stool transplanted into cecal area and scope removed.  Retroflexion was not performed. The time to cecum=8 minutes 0 seconds.  Withdrawal time=4 minutes 0 seconds.  The scope was withdrawn and the procedure completed. COMPLICATIONS: There were no immediate complications.  ENDOSCOPIC IMPRESSION: 1) small segment of active UC in distal sigmoid - aphthae and granular mucosa - mild 2) normal terminal ileum - could see but not enter 3) otherwise normal colonoscopy limited inspection - 250 cc [facility] donor 82 liquid stool transplanted into cecal area and scope removed  RECOMMENDATIONS: Observe off  vancomycin F/U Dr.  Rhea Vance as planned and Dr.  Drue Vance as needed  eSigned:  Iva Booparl E Vineet Vance, Whitney Vance, [facility] 06/07/2014 2:34 PM   cc: Whitney Vance, Whitney Vance and Whitney Vance, Whitney Vance

## 2014-06-07 NOTE — H&P (Signed)
[facility] History and Physical   Primary Care Physician:  Swazil[place]JORDAN, BETTY G, MD   Reason for Procedure:  Treat C diff colitis  Plan:     Colonoscopy + fecal microbiotica transplant. The risks and benefits as well as alternatives of endoscopic procedure(s) have been discussed and reviewed. All questions answered. The patient agrees to proceed.   HPI: Whitney Vance is a 63 y.o. female with chronic recurrent C diff colitis here for colonoscopy adnministered fecal microbiotica transplant.   Past Medical History  Diagnosis Date  . Vitamin B 12 deficiency   . Ulcerative colitis   . Hiatal hernia   . GERD (gastroesophageal reflux disease)   . H/O Clostridium difficile infection 11/2013, 01/2014    has it now 11/16/2013  . Depression   . Fatty liver   . Pneumonia     40 years ago  . Arthritis     lower back, thumbs  . Anemia     low iron during pregnancy    Past Surgical History  Procedure Laterality Date  . Foot surgery  09/28/13  . Vaginal hysterectomy    . Appendectomy    . Cesarean section    . Colonoscopy    . Tonsillectomy    . Tubal ligation      Prior to Admission medications   Medication Sig Start Date End Date Taking? Authorizing Provider  aspirin EC 81 MG tablet Take 81 mg by mouth daily.   Yes Historical Provider, MD  mesalamine (LIALDA) 1.2 G EC tablet Take 4.8 g by mouth daily with breakfast.    Yes Historical Provider, MD  Misc Natural Products ([employer] GLUCOS-CHONDROIT-MSM TS PO) Take 2 tablets by mouth at bedtime.   Yes Historical Provider, MD  PARoxetine (PAXIL) 20 MG tablet Take 20 mg by mouth daily.   Yes Historical Provider, MD  ranitidine (ZANTAC) 150 MG tablet Take 1 tablet (150 mg total) by mouth 2 (two) times daily. Patient taking differently: Take 150 mg by mouth 2 (two) times daily as needed for heartburn.  02/21/14  Yes Beverley FiedlerJay M Pyrtle, MD  ALPRAZolam Prudy Feeler(XANAX) 0.25 MG tablet Take 0.25 mg by mouth 3 (three) times daily after meals.     Historical Provider, MD  loratadine (CLARITIN) 10 MG tablet Take 10 mg by mouth daily.    Historical Provider, MD  vancomycin (VANCOCIN) 125 MG capsule Take 1 capsule (125 mg total) by mouth 4 (four) times daily. Patient not taking: Reported on 06/06/2014 05/14/14   Randall Hissornelius N Van Dam, MD    Current Facility-Administered Medications  Medication Dose Route Frequency Provider Last Rate Last Dose  . 0.9 %  sodium chloride infusion   Intravenous Continuous Iva Booparl E Gessner, MD      . lactated ringers infusion   Intravenous Continuous Iva Booparl E Gessner, MD      . lactated ringers infusion   Intravenous Continuous Iva Booparl E Gessner, MD        Allergies as of 06/05/2014 - Review Complete 05/18/2014  Allergen Reaction Noted  . Demerol [meperidine] Other (See Comments) 11/16/2013  . Tape Dermatitis 05/01/2014    Family History  Problem Relation Age of Onset  . Ulcerative colitis Mother   . Alzheimer's disease Mother   . Colon cancer Neg Hx   . Rectal cancer Neg Hx   . Stomach cancer Neg Hx     History   Social History  . Marital Status: Married    Spouse Name: N/A    Number of Children:  N/A  . Years of Education: N/A   Occupational History  . 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    Social History Main Topics  . Smoking status: Former Smoker -- 20 years    Types: Cigarettes    Quit date: 06/09/1986  . Smokeless tobacco: Never Used  . Alcohol Use: No  . Drug Use: No  . Sexual Activity: Not on file   Other Topics Concern  . Not on file   Social History Narrative    Review of Systems: All other review of systems negative except as mentioned in the HPI.  Physical Exam: Vital signs in last 24 hours: Temp:  [98.5 F (36.9 C)] 98.5 F (36.9 C) (12/30 1213) Pulse Rate:  [80] 80 (12/30 1213) Resp:  [15] 15 (12/30 1213) BP: (132)/(57) 132/57 mmHg (12/30 1213) SpO2:  [95 %] 95 % (12/30 1213) Weight:  [209 lb (94.802 kg)] 209 lb (94.802 kg) (12/30 1213)   General:    Alert,  Well-developed, well-nourished, pleasant and cooperative in NAD Lungs:  Clear throughout to auscultation.   Heart:  Regular rate and rhythm; no murmurs, clicks, rubs,  or gallops. Abdomen:  Soft, nontender and nondistended. Normal bowel sounds.   Neuro/Psych:  Alert and cooperative. Normal mood and affect. A and O x 3   @Carl  Sena SlateE. Gessner, MD, [facility] [phone] (pager) 06/07/2014 1:09 PM@

## 2014-06-07 NOTE — Anesthesia Postprocedure Evaluation (Signed)
  Anesthesia Post-op Note  Patient: Whitney Vance  Procedure(s) Performed: Procedure(s) with comments: COLONOSCOPY WITH PROPOFOL (N/A) - fecal transplant FECAL TRANSPLANT (N/A)  Patient Location: Endoscopy Unit  Anesthesia Type:MAC  Level of Consciousness: awake, alert  and oriented  Airway and Oxygen Therapy: Patient Spontanous Breathing  Post-op Pain: none  Post-op Assessment: Post-op Vital signs reviewed  Post-op Vital Signs: Reviewed and stable  Last Vitals:  Filed Vitals:   06/07/14 1213  BP: 132/57  Pulse: 80  Temp: 36.9 C  Resp: 15    Complications: No apparent anesthesia complications

## 2014-06-07 NOTE — Anesthesia Preprocedure Evaluation (Addendum)
Anesthesia Evaluation  Patient identified by MRN, date of birth, ID band Patient awake    Reviewed: Allergy & Precautions, H&P , NPO status , Patient's Chart, lab work & pertinent test results  Airway Mallampati: II  TM Distance: >3 FB Neck ROM: Full    Dental no notable dental hx. (+) Upper Dentures, Partial Lower, Dental Advisory Given   Pulmonary asthma , former smoker,  breath sounds clear to auscultation  Pulmonary exam normal       Cardiovascular negative cardio ROS  Rhythm:Regular Rate:Normal     Neuro/Psych Depression negative neurological ROS     GI/Hepatic Neg liver ROS, hiatal hernia, GERD-  Controlled,  Endo/Other  negative endocrine ROS  Renal/GU negative Renal ROS  negative genitourinary   Musculoskeletal  (+) Arthritis -, Osteoarthritis,    Abdominal   Peds  Hematology negative hematology ROS (+)   Anesthesia Other Findings   Reproductive/Obstetrics negative OB ROS                            Anesthesia Physical Anesthesia Plan  ASA: II  Anesthesia Plan: MAC   Post-op Pain Management:    Induction: Intravenous  Airway Management Planned: Simple Face Mask  Additional Equipment:   Intra-op Plan:   Post-operative Plan:   Informed Consent: I have reviewed the patients History and Physical, chart, labs and discussed the procedure including the risks, benefits and alternatives for the proposed anesthesia with the patient or authorized representative who has indicated his/her understanding and acceptance.   Dental advisory given  Plan Discussed with: CRNA  Anesthesia Plan Comments:         Anesthesia Quick Evaluation

## 2014-06-07 NOTE — Transfer of Care (Signed)
Immediate Anesthesia Transfer of Care Note  Patient: Whitney Vance  Procedure(s) Performed: Procedure(s) with comments: COLONOSCOPY WITH PROPOFOL (N/A) - fecal transplant FECAL TRANSPLANT (N/A)  Patient Location: Endoscopy Unit  Anesthesia Type:MAC  Level of Consciousness: awake, alert  and oriented  Airway & Oxygen Therapy: Patient Spontanous Breathing  Post-op Assessment: Report given to PACU RN  Post vital signs: Reviewed and stable  Complications: No apparent anesthesia complications

## 2014-06-08 ENCOUNTER — Encounter (HOSPITAL_COMMUNITY): Payer: Self-pay | Admitting: Internal Medicine

## 2014-07-07 ENCOUNTER — Other Ambulatory Visit: Payer: Self-pay | Admitting: *Deleted

## 2014-07-07 MED ORDER — MESALAMINE 1.2 G PO TBEC: 4.8000 g | DELAYED_RELEASE_TABLET | Freq: Every day | ORAL | Status: DC

## 2014-07-13 ENCOUNTER — Ambulatory Visit: Payer: 59 | Admitting: Internal Medicine

## 2014-10-11 ENCOUNTER — Other Ambulatory Visit (HOSPITAL_COMMUNITY): Payer: Self-pay | Admitting: Family Medicine

## 2014-10-11 DIAGNOSIS — Z1231 Encounter for screening mammogram for malignant neoplasm of breast: Secondary | ICD-10-CM

## 2014-10-24 ENCOUNTER — Ambulatory Visit (HOSPITAL_COMMUNITY)
Admission: RE | Admit: 2014-10-24 | Discharge: 2014-10-24 | Disposition: A | Discharge status: Home / Self Care | Payer: 59 | Source: Ambulatory Visit | Attending: Family Medicine | Admitting: Family Medicine

## 2014-10-24 DIAGNOSIS — Z1231 Encounter for screening mammogram for malignant neoplasm of breast: Secondary | ICD-10-CM | POA: Insufficient documentation

## 2014-10-24 IMAGING — MG MM DIGITAL SCREENING BILAT
6 series · 6 of 6 positions shown · non-contrast
Comparison: Previous exam(s).

CLINICAL DATA: Screening.

EXAM:
DIGITAL SCREENING BILATERAL MAMMOGRAM WITH CAD

[R CC]
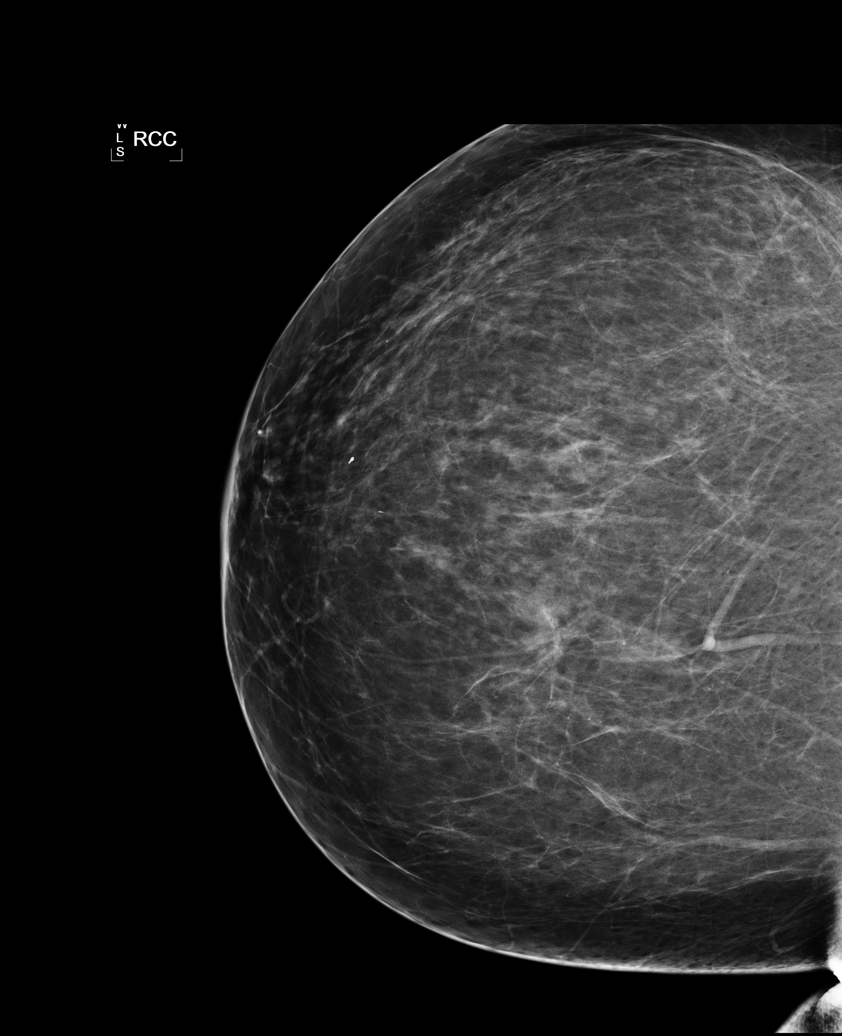

[R MLO (1 of 2)]
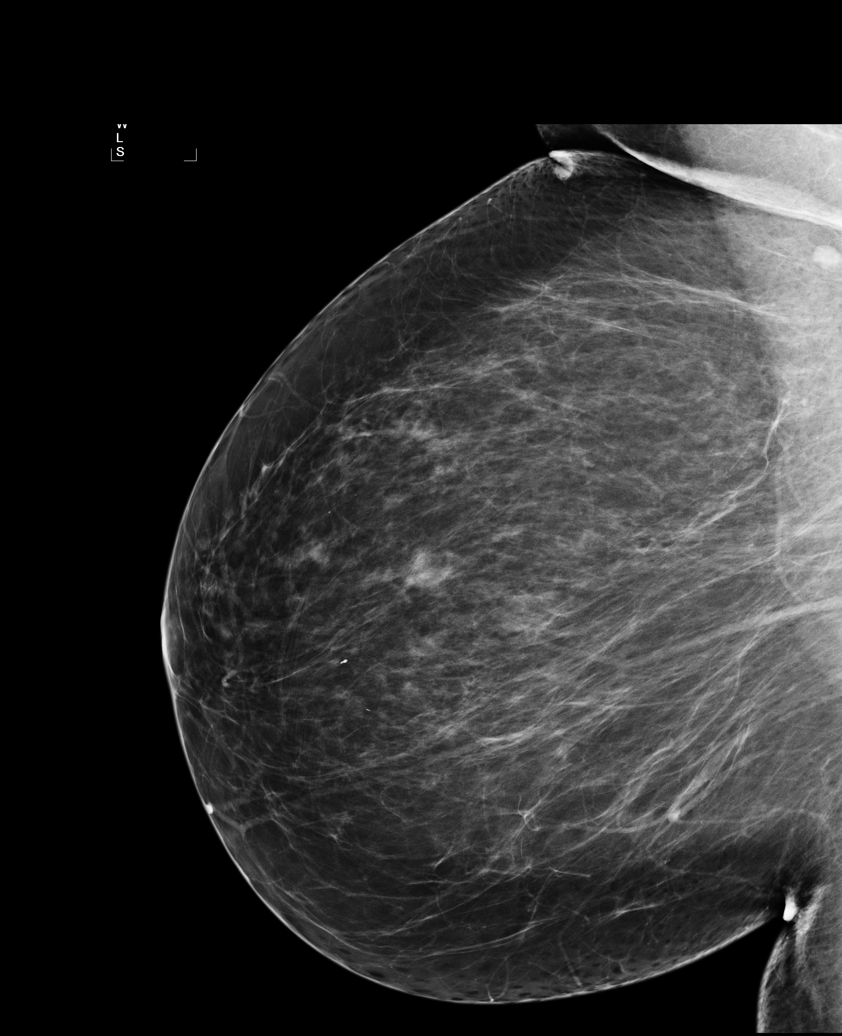

[L CC]
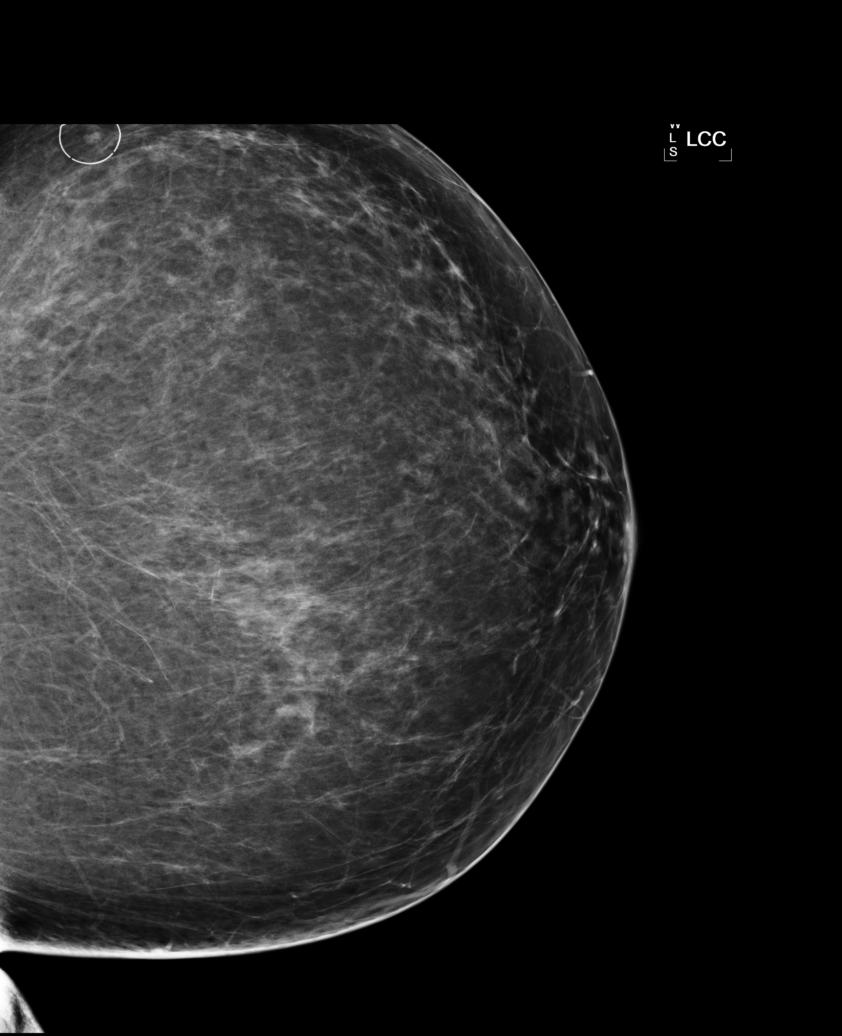

[L MLO (1 of 2)]
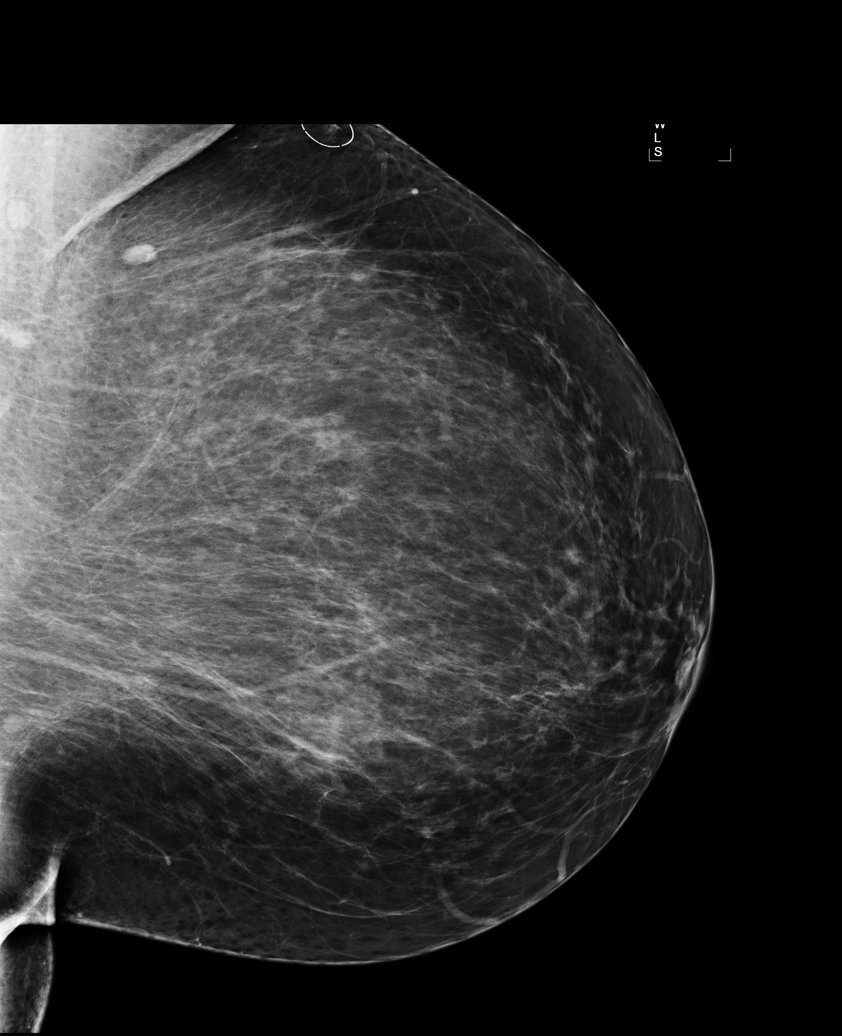

[L MLO (2 of 2)]
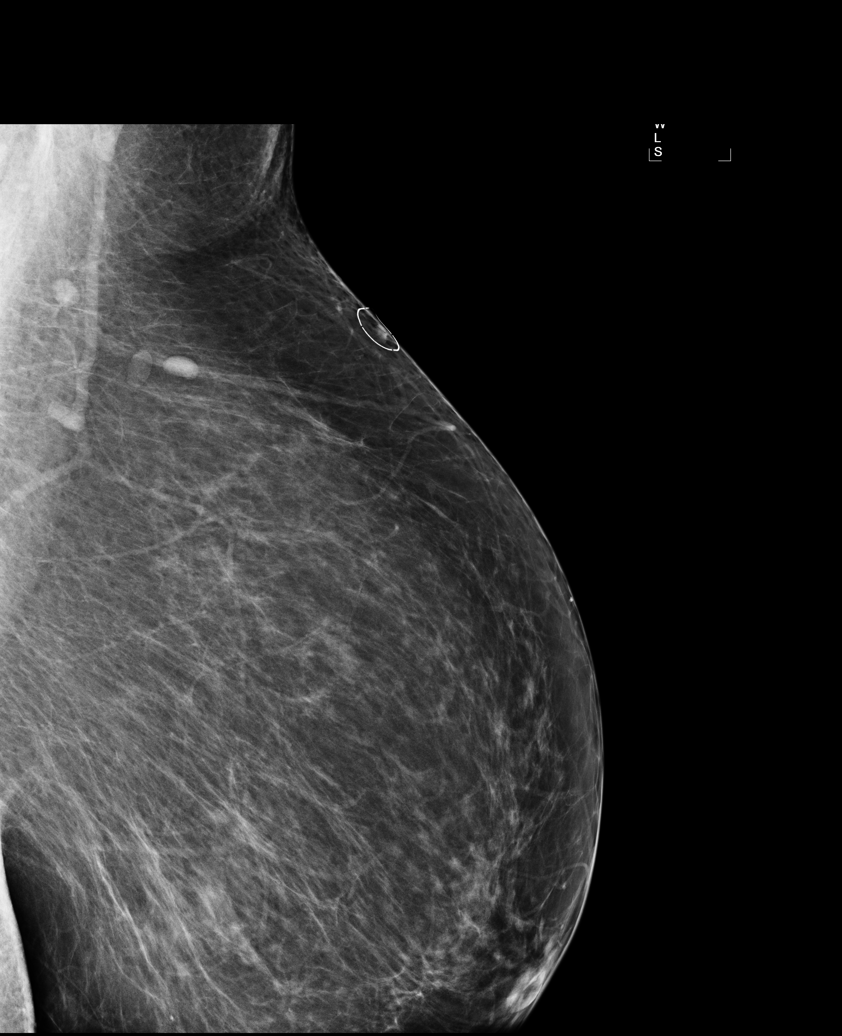

[R MLO (2 of 2)]
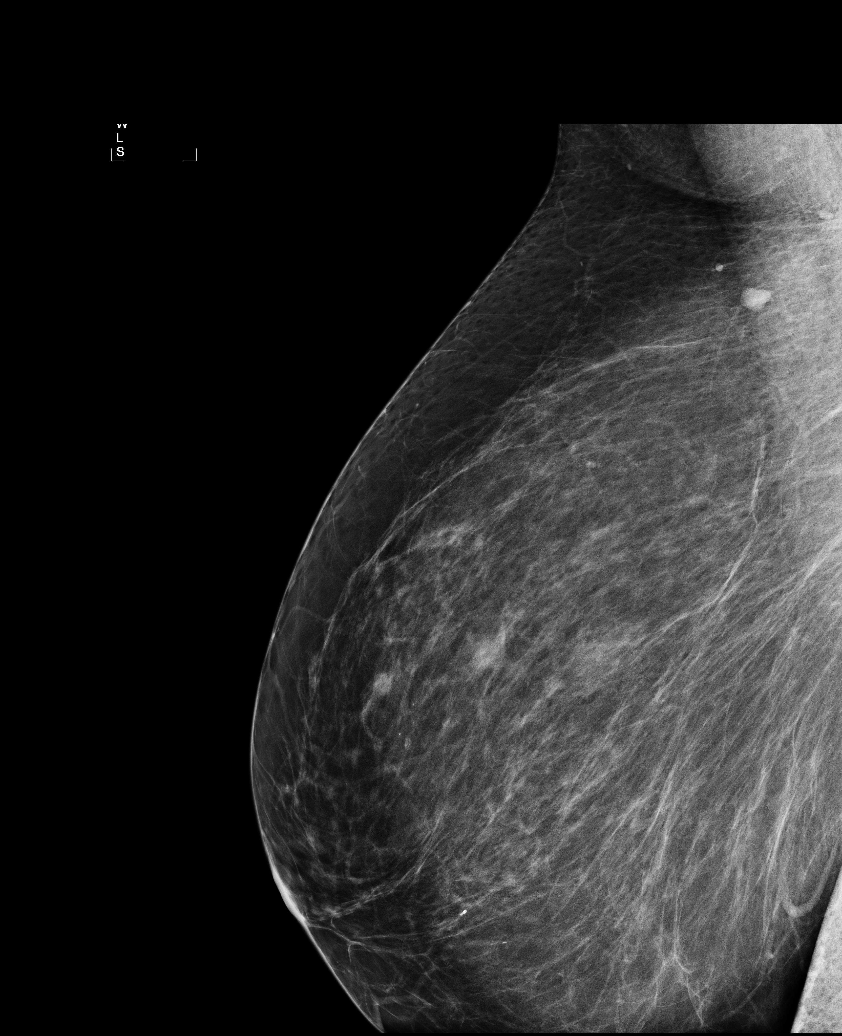

[6 of 6 positions shown; findings below may reference images not displayed]

ACR Breast Density Category b: There are scattered areas of
fibroglandular density.
FINDINGS: In the right breast, a possible mass warrants further evaluation. In
the left breast, no findings suspicious for malignancy. Images were
processed with CAD.
IMPRESSION: Further evaluation is suggested for possible mass in the right
breast.

RECOMMENDATION:
Diagnostic mammogram and possibly ultrasound of the right breast.
(Code:[DA])

The patient will be contacted regarding the findings, and additional
imaging will be scheduled.

BI-RADS CATEGORY  0: Incomplete. Need additional imaging evaluation
and/or prior mammograms for comparison.

## 2014-10-25 ENCOUNTER — Other Ambulatory Visit: Payer: Self-pay | Admitting: Family Medicine

## 2014-10-25 DIAGNOSIS — R928 Other abnormal and inconclusive findings on diagnostic imaging of breast: Secondary | ICD-10-CM

## 2014-10-31 ENCOUNTER — Ambulatory Visit
Admission: RE | Admit: 2014-10-31 | Discharge: 2014-10-31 | Disposition: A | Discharge status: Home / Self Care | Payer: 59 | Source: Ambulatory Visit | Attending: Family Medicine | Admitting: Family Medicine

## 2014-10-31 DIAGNOSIS — R928 Other abnormal and inconclusive findings on diagnostic imaging of breast: Secondary | ICD-10-CM

## 2014-11-07 ENCOUNTER — Encounter: Payer: Self-pay | Admitting: Nurse Practitioner

## 2014-11-07 ENCOUNTER — Ambulatory Visit (INDEPENDENT_AMBULATORY_CARE_PROVIDER_SITE_OTHER): Payer: 59 | Admitting: Nurse Practitioner

## 2014-11-07 VITALS — BP 130/86 | HR 68 | Ht 62.25 in | Wt 209.0 lb

## 2014-11-07 DIAGNOSIS — Z0000 Encounter for general adult medical examination without abnormal findings: Secondary | ICD-10-CM | POA: Diagnosis not present

## 2014-11-07 DIAGNOSIS — Z01419 Encounter for gynecological examination (general) (routine) without abnormal findings: Secondary | ICD-10-CM

## 2014-11-07 LAB — POCT URINALYSIS DIPSTICK
BILIRUBIN UA: NEGATIVE
Blood, UA: NEGATIVE
Glucose, UA: NEGATIVE
KETONES UA: NEGATIVE
LEUKOCYTES UA: NEGATIVE
Nitrite, UA: NEGATIVE
PROTEIN UA: NEGATIVE
UROBILINOGEN UA: NEGATIVE
pH, UA: 5

## 2014-11-07 NOTE — Patient Instructions (Signed)

## 2014-11-07 NOTE — Progress Notes (Signed)
Patient ID: Whitney Vance, female   DOB: [birth year], 64 y.o.   MRN: [id number] 64 y.o. [id number]. Married  Caucasian Fe here for [employer] annual exam.  Previous pt here but not seen since 07/2011.  Interim health issues: Had recurrent C diff with a occurrence X 5 then had fecal transplant. Surgery right foot last year. 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  Husband has been working there for past 8 years and driving back and forth.  Patient's last menstrual period was 06/09/1993 (approximate).          Sexually active: Yes.    The current method of family planning:  hysterectomy Exercising: No. Smoker:  no  Health Maintenance: Pap:  04/19/99, negative (TAH) MMG:  10-31-2014 with Right 3D diagnostic and ultrasound; Bi-Rads 3, probably benign repeat in 6 months Colonoscopy:  06/07/2014 done with Fecal Transplant procedure, repeat in 5 years, sooner if needed BMD:   12-09-2005 S 1.8/ L 1.2/ R 1.0 TDaP:  ? Labs:  HB:  PCP  Urine:  negative   reports that she quit smoking about 28 years ago. Her smoking use included Cigarettes. She quit after 20 years of use. She has never used smokeless tobacco. She reports that she does not drink alcohol or use illicit drugs.  Past Medical History  Diagnosis Date  . Vitamin B 12 deficiency   . Ulcerative colitis   . Hiatal hernia   . GERD (gastroesophageal reflux disease)   . H/O Clostridium difficile infection 11/2013, 01/2014    has it now 11/16/2013  . Depression   . Fatty liver   . Pneumonia     40 years ago  . Arthritis     lower back, thumbs  . Anemia     low iron during pregnancy    Past Surgical History  Procedure Laterality Date  . Foot surgery Right 09/28/13    plantar fasciitis surgery  . Appendectomy    . Cesarean section  1988  . Tonsillectomy    . Tubal ligation  1988  . Colonoscopy with propofol N/A 06/07/2014    Procedure: COLONOSCOPY WITH PROPOFOL;  Surgeon: Iva Boop, MD;  Location: [facility] ENDOSCOPY;  Service:  Endoscopy;  Laterality: N/A;  fecal transplant  . Fecal transplant N/A 06/07/2014    Procedure: FECAL TRANSPLANT;  Surgeon: Iva Boop, MD;  Location[facility] ENDOSCOPY;  Service: Endoscopy;  Laterality: N/A;  . Tarsal tunnel release Right 09/28/13    [facility] Dr. Allena Katz  . Total abdominal hysterectomy w/ bilateral salpingoophorectomy  06/1993    secondary to fibroids    Current Outpatient Prescriptions  Medication Sig Dispense Refill  . aspirin EC 81 MG tablet Take 81 mg by mouth daily.    Marland Kitchen loratadine (CLARITIN) 10 MG tablet Take 10 mg by mouth daily.    . Misc Natural Products ([employer] GLUCOS-CHONDROIT-MSM TS PO) Take 2 tablets by mouth at bedtime.    . naproxen (NAPROSYN) 500 MG tablet   0  . PARoxetine (PAXIL) 20 MG tablet Take 20 mg by mouth daily.    . ranitidine (ZANTAC) 150 MG tablet Take 1 tablet (150 mg total) by mouth 2 (two) times daily. (Patient taking differently: Take 150 mg by mouth 2 (two) times daily as needed for heartburn. ) 90 tablet 5   No current facility-administered medications for this visit.    Family History  Problem Relation Age of Onset  . Ulcerative colitis Mother   . Alzheimer's disease Mother   .  Colon cancer Neg Hx   . Rectal cancer Neg Hx   . Stomach cancer Neg Hx   . Spina bifida Brother   . Alzheimer's disease Maternal Grandmother   . Heart disease Maternal Grandfather   . Heart disease Brother   . Macular degeneration Brother   . Heart attack Brother   . Hypertension Brother     ROS:  Pertinent items are noted in HPI.  Otherwise, a comprehensive ROS was negative.  Exam:   BP 130/86 mmHg  Pulse 68  Ht 5' 2.25" (1.581 m)  Wt 209 lb (94.802 kg)  BMI 37.93 kg/m2  LMP 06/09/1993 (Approximate) Height: 5' 2.25" (158.1 cm) Ht Readings from Last 3 Encounters:  11/07/14 5' 2.25" (1.581 m)  06/07/14 5\' 2"  (1.575 m)  06/06/14 5' 2.5" (1.588 m)    General appearance: alert, cooperative and appears stated age Head: Normocephalic, without obvious  abnormality, atraumatic Neck: no adenopathy, supple, symmetrical, trachea midline and thyroid normal to inspection and palpation Lungs: clear to auscultation bilaterally Breasts: normal appearance, no masses or tenderness Heart: regular rate and rhythm Abdomen: soft, non-tender; no masses,  no organomegaly Extremities: extremities normal, atraumatic, no cyanosis or edema Skin: Skin color, texture, turgor normal. No rashes or lesions Lymph nodes: Cervical, supraclavicular, and axillary nodes normal. No abnormal inguinal nodes palpated Neurologic: Grossly normal   Pelvic: External genitalia:  no lesions              Urethra:  normal appearing urethra with no masses, tenderness or lesions              Bartholin's and Skene's: normal                 Vagina: normal appearing vagina with normal color and discharge, no lesions              Cervix: absent              Pap taken: No. Bimanual Exam:  Uterus:  uterus absent              Adnexa: no mass, fullness, tenderness               Rectovaginal: Confirms               Anus:  normal sphincter tone, no lesions  Chaperone present: No  A:  Well Woman with normal exam  S/P TAH/ BSO 1995 secondary to fibroids - off ERT 07/2011  S/P fecal transplant 06/07/14 following chronic C - Diff.  P:   Reviewed health and wellness pertinent to exam  Pap smear as above  Mammogram is due for repeat in 6 months  Counseled on breast self exam, mammography screening, adequate intake of calcium and vitamin D, diet and exercise, Kegel's exercises return annually or prn  An After Visit Summary was printed and given to the patient.

## 2014-11-07 NOTE — Progress Notes (Signed)
Encounter reviewed by Dr. Brook Silva.  

## 2015-02-21 ENCOUNTER — Ambulatory Visit: Payer: 59 | Admitting: Physician Assistant

## 2015-02-21 ENCOUNTER — Telehealth: Payer: Self-pay | Admitting: Physician Assistant

## 2015-03-29 ENCOUNTER — Other Ambulatory Visit: Payer: Self-pay | Admitting: Family Medicine

## 2015-03-29 DIAGNOSIS — N6489 Other specified disorders of breast: Secondary | ICD-10-CM

## 2015-05-08 ENCOUNTER — Other Ambulatory Visit: Payer: 59

## 2015-05-14 ENCOUNTER — Ambulatory Visit
Admission: RE | Admit: 2015-05-14 | Discharge: 2015-05-14 | Disposition: A | Discharge status: Home / Self Care | Payer: 59 | Source: Ambulatory Visit | Attending: Family Medicine | Admitting: Family Medicine

## 2015-05-14 DIAGNOSIS — N6489 Other specified disorders of breast: Secondary | ICD-10-CM

## 2015-11-12 ENCOUNTER — Ambulatory Visit: Payer: 59 | Admitting: Nurse Practitioner

## 2015-11-19 ENCOUNTER — Other Ambulatory Visit: Payer: Self-pay | Admitting: Emergency Medicine

## 2015-11-19 ENCOUNTER — Other Ambulatory Visit: Payer: Self-pay | Admitting: Family Medicine

## 2015-11-19 DIAGNOSIS — Z853 Personal history of malignant neoplasm of breast: Secondary | ICD-10-CM

## 2015-11-19 DIAGNOSIS — N6489 Other specified disorders of breast: Secondary | ICD-10-CM

## 2015-11-29 ENCOUNTER — Ambulatory Visit
Admission: RE | Admit: 2015-11-29 | Discharge: 2015-11-29 | Disposition: A | Discharge status: Home / Self Care | Payer: 59 | Source: Ambulatory Visit | Attending: Family Medicine | Admitting: Family Medicine

## 2015-11-29 DIAGNOSIS — N6489 Other specified disorders of breast: Secondary | ICD-10-CM

## 2015-12-05 IMAGING — CR DG ABDOMEN 2V
2 series · 2 of 2 positions shown · non-contrast
Comparison: CT 01/26/2007.

CLINICAL DATA: Vomiting and diarrhea.

EXAM:
ABDOMEN - 2 VIEW

[w abdomen upright *]
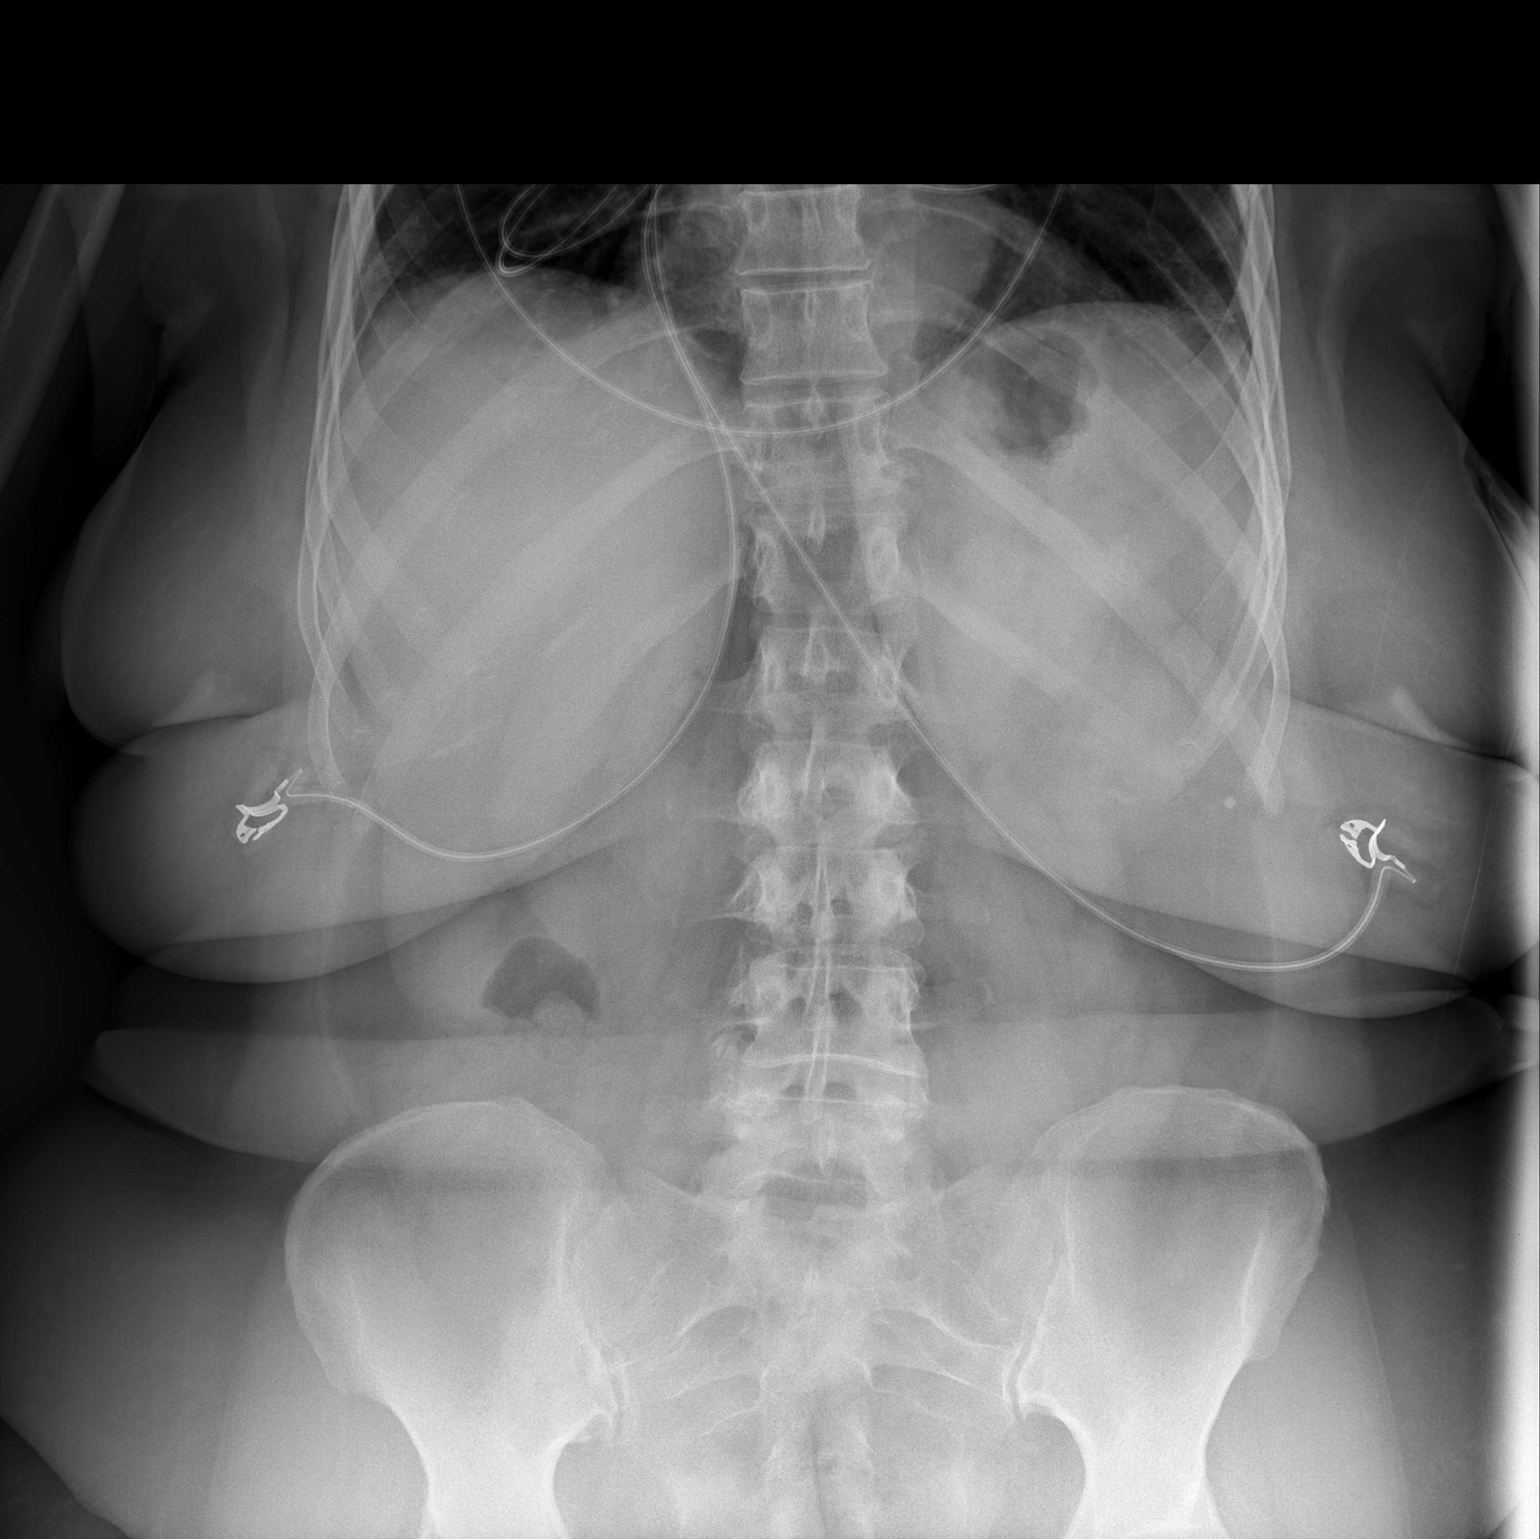

[t abdomen supine]
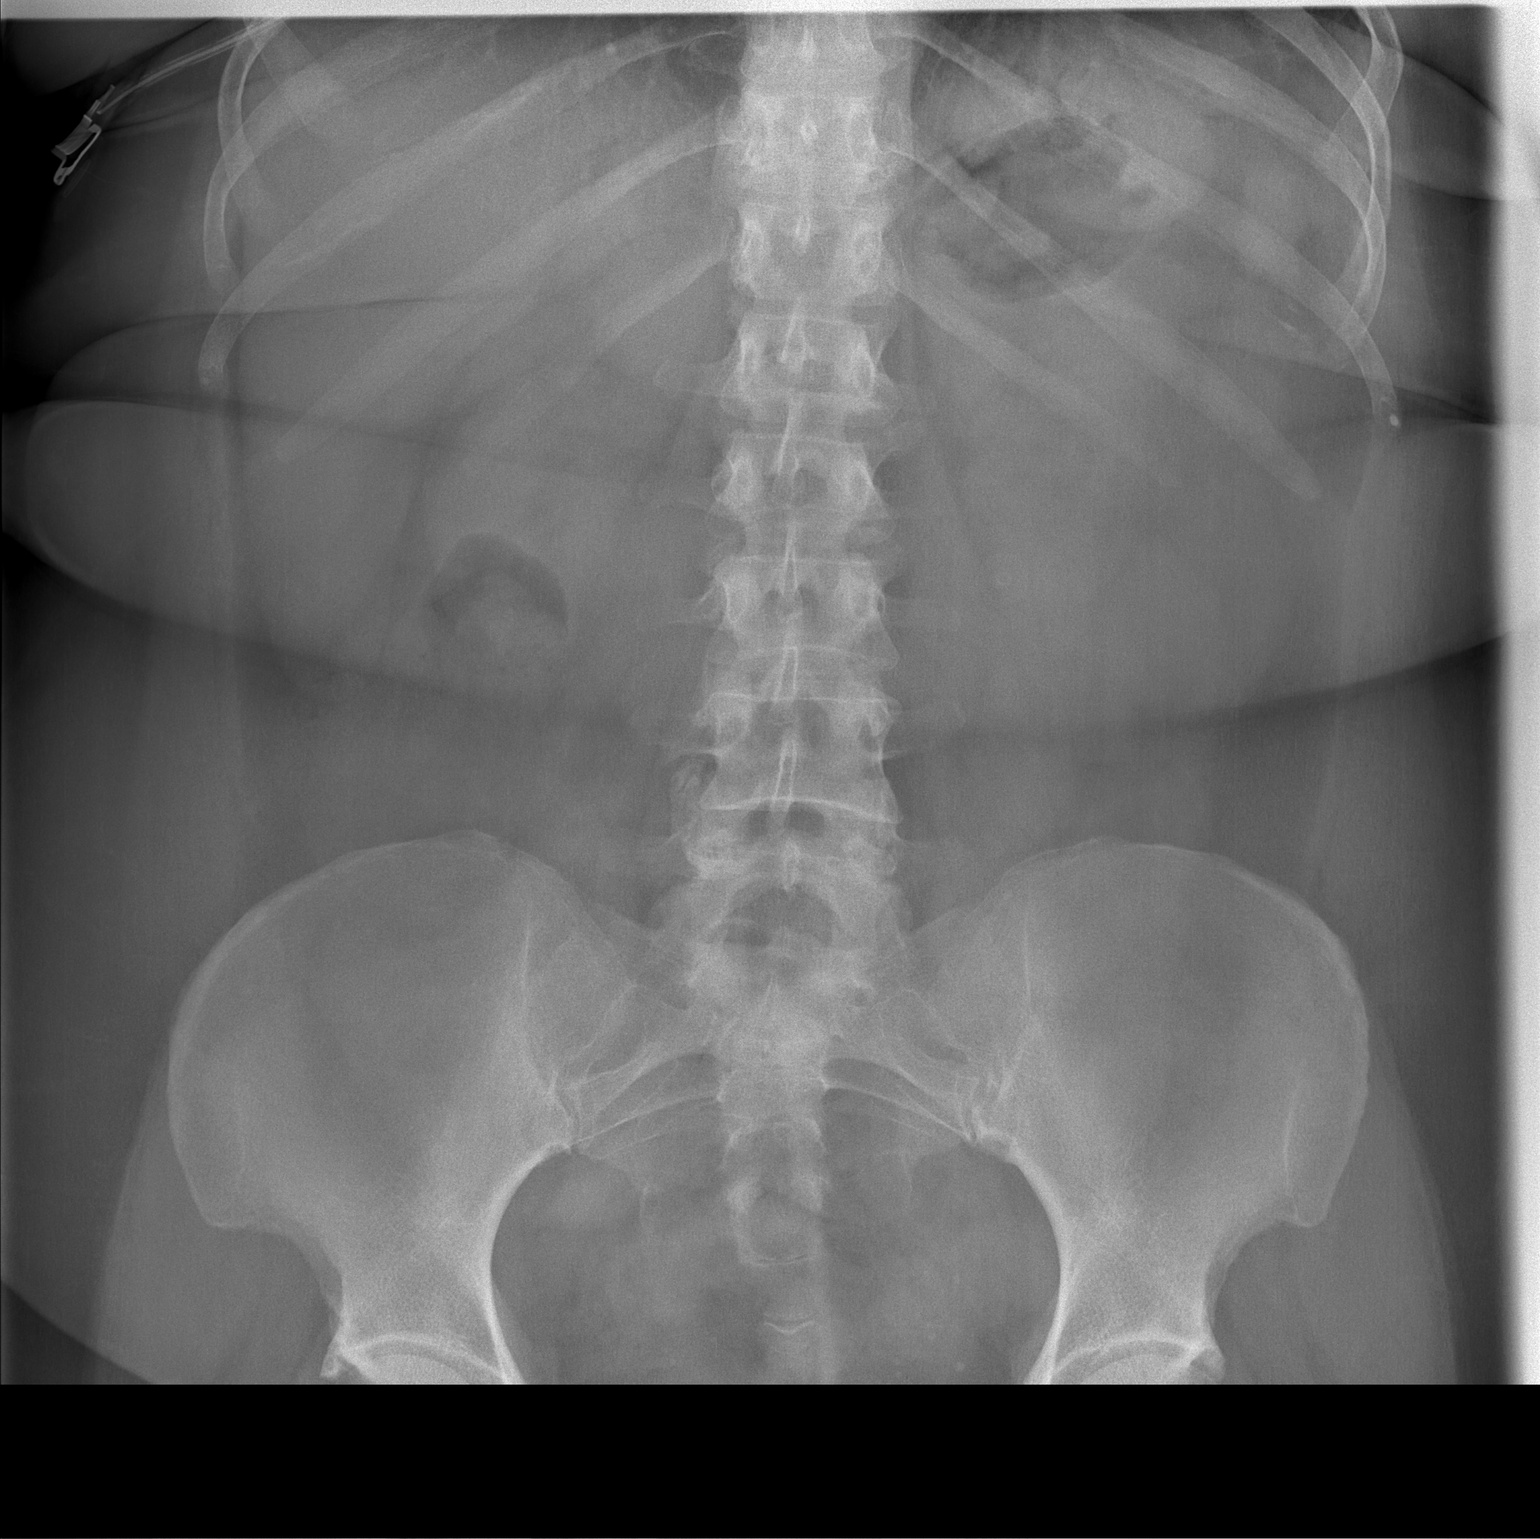

[2 of 2 positions shown; findings below may reference images not displayed]

FINDINGS: Soft tissue structures are unremarkable. The gas pattern is
nonspecific. Calcifications within the pelvis consistent with
phleboliths. No bowel distention. No free air. Mild basilar
atelectasis.
IMPRESSION: No acute abnormality.

## 2016-12-31 ENCOUNTER — Other Ambulatory Visit: Payer: Self-pay

## 2016-12-31 ENCOUNTER — Other Ambulatory Visit: Payer: Self-pay | Admitting: Nurse Practitioner

## 2016-12-31 ENCOUNTER — Other Ambulatory Visit: Payer: Self-pay | Admitting: Family Medicine

## 2016-12-31 DIAGNOSIS — Z1231 Encounter for screening mammogram for malignant neoplasm of breast: Secondary | ICD-10-CM

## 2017-01-07 ENCOUNTER — Ambulatory Visit
Admission: RE | Admit: 2017-01-07 | Discharge: 2017-01-07 | Disposition: A | Discharge status: Home / Self Care | Payer: 59 | Source: Ambulatory Visit | Attending: Nurse Practitioner | Admitting: Nurse Practitioner

## 2017-01-07 DIAGNOSIS — Z1231 Encounter for screening mammogram for malignant neoplasm of breast: Secondary | ICD-10-CM

## 2018-03-10 ENCOUNTER — Other Ambulatory Visit: Payer: Self-pay | Admitting: Nurse Practitioner

## 2018-03-10 DIAGNOSIS — Z1231 Encounter for screening mammogram for malignant neoplasm of breast: Secondary | ICD-10-CM

## 2018-03-18 ENCOUNTER — Ambulatory Visit
Admission: RE | Admit: 2018-03-18 | Discharge: 2018-03-18 | Disposition: A | Discharge status: Home / Self Care | Payer: Medicare Other | Source: Ambulatory Visit | Attending: Nurse Practitioner | Admitting: Nurse Practitioner

## 2018-03-18 DIAGNOSIS — Z1231 Encounter for screening mammogram for malignant neoplasm of breast: Secondary | ICD-10-CM

## 2018-11-24 ENCOUNTER — Encounter: Payer: Self-pay | Admitting: Internal Medicine
